# Patient Record
Sex: Female | Born: 1945
Health system: Southern US, Community
[De-identification: ages and names within clinical notes are randomized; demographics above are authoritative.]

## PROBLEM LIST (undated history)

## (undated) DIAGNOSIS — F32A Depression, unspecified: Secondary | ICD-10-CM

## (undated) DIAGNOSIS — E039 Hypothyroidism, unspecified: Secondary | ICD-10-CM

## (undated) DIAGNOSIS — I4891 Unspecified atrial fibrillation: Secondary | ICD-10-CM

## (undated) DIAGNOSIS — R519 Headache, unspecified: Secondary | ICD-10-CM

## (undated) DIAGNOSIS — M8580 Other specified disorders of bone density and structure, unspecified site: Secondary | ICD-10-CM

## (undated) DIAGNOSIS — F329 Major depressive disorder, single episode, unspecified: Secondary | ICD-10-CM

## (undated) DIAGNOSIS — E079 Disorder of thyroid, unspecified: Secondary | ICD-10-CM

## (undated) DIAGNOSIS — I499 Cardiac arrhythmia, unspecified: Secondary | ICD-10-CM

## (undated) DIAGNOSIS — K219 Gastro-esophageal reflux disease without esophagitis: Secondary | ICD-10-CM

## (undated) DIAGNOSIS — R51 Headache: Secondary | ICD-10-CM

## (undated) DIAGNOSIS — G250 Essential tremor: Secondary | ICD-10-CM

## (undated) DIAGNOSIS — J45909 Unspecified asthma, uncomplicated: Secondary | ICD-10-CM

## (undated) DIAGNOSIS — M1990 Unspecified osteoarthritis, unspecified site: Secondary | ICD-10-CM

## (undated) DIAGNOSIS — I10 Essential (primary) hypertension: Secondary | ICD-10-CM

## (undated) DIAGNOSIS — N189 Chronic kidney disease, unspecified: Secondary | ICD-10-CM

## (undated) DIAGNOSIS — M797 Fibromyalgia: Secondary | ICD-10-CM

## (undated) HISTORY — DX: Chronic kidney disease, unspecified: N18.9

## (undated) HISTORY — DX: Headache: R51

## (undated) HISTORY — DX: Cardiac arrhythmia, unspecified: I49.9

## (undated) HISTORY — DX: Major depressive disorder, single episode, unspecified: F32.9

## (undated) HISTORY — DX: Essential tremor: G25.0

## (undated) HISTORY — PX: GYNECOLOGIC CRYOSURGERY: SHX857

## (undated) HISTORY — DX: Headache, unspecified: R51.9

## (undated) HISTORY — DX: Unspecified osteoarthritis, unspecified site: M19.90

## (undated) HISTORY — PX: CHOLECYSTECTOMY: SHX55

## (undated) HISTORY — DX: Depression, unspecified: F32.A

## (undated) HISTORY — DX: Unspecified asthma, uncomplicated: J45.909

## (undated) HISTORY — DX: Unspecified atrial fibrillation: I48.91

## (undated) HISTORY — PX: OTHER SURGICAL HISTORY: SHX169

## (undated) HISTORY — DX: Fibromyalgia: M79.7

## (undated) HISTORY — DX: Other specified disorders of bone density and structure, unspecified site: M85.80

## (undated) HISTORY — DX: Disorder of thyroid, unspecified: E07.9

---

## 2002-04-25 ENCOUNTER — Encounter: Admission: RE | Admit: 2002-04-25 | Discharge: 2002-05-10 | Payer: Self-pay | Admitting: *Deleted

## 2004-10-23 ENCOUNTER — Ambulatory Visit: Payer: Self-pay | Admitting: Psychiatry

## 2004-11-06 ENCOUNTER — Ambulatory Visit: Payer: Self-pay | Admitting: Psychiatry

## 2009-06-26 ENCOUNTER — Ambulatory Visit: Admission: RE | Admit: 2009-06-26 | Discharge: 2009-06-26 | Payer: Self-pay | Admitting: Neurology

## 2009-08-16 ENCOUNTER — Ambulatory Visit: Admission: RE | Admit: 2009-08-16 | Discharge: 2009-08-16 | Payer: Self-pay | Admitting: Neurology

## 2010-12-23 ENCOUNTER — Ambulatory Visit (HOSPITAL_COMMUNITY): Admit: 2010-12-23 | Payer: Self-pay | Admitting: Psychology

## 2011-01-09 ENCOUNTER — Ambulatory Visit (HOSPITAL_COMMUNITY): Admit: 2011-01-09 | Payer: Self-pay | Admitting: Psychology

## 2011-05-06 NOTE — Procedures (Signed)
NAMEELLISON, LEISURE           ACCOUNT NO.:  [id number]   MEDICAL RECORD NO.:  [id number]          PATIENT TYPE:  OUT   LOCATION:  SLEE                          FACILITY:  [facility]   PHYSICIAN:  Kofi A. Gerilyn Pilgrim, M.D. DATE OF BIRTH:  09-23-1946   DATE OF PROCEDURE:  06/26/2009  DATE OF DISCHARGE:  06/26/2009                             SLEEP DISORDER REPORT   INDICATION:  This is a 62-year lady who presents with hypersomnia,  snoring and is being evaluated for obstructive sleep apnea syndrome.   MEDICATIONS:  Wellbutrin, MS Contin, Zantac, Singulair, Celexa,  levothyroxine, verapamil, Flovent, Veramyst, Relpax, aspirin, Prilosec,  multivitamin, calcium, vitamin D, C and omega-3.   Epworth sleepiness scale 18.  BMI 33.   ARCHITECTURAL SUMMARY:  This is a full night recording.  The total  recording time is 371 minutes.  The sleep efficiency is 62%, sleep  latency 56.5 minutes, REM latency 132.5 minutes.   STAGE N1:  20.4%.   STAGE N2:  68.3%.   STAGE N3:  2.6%.   REM SLEEP:  8.7%.   RESPIRATORY SUMMARY:  The baseline oxygen saturation is 99% with the  lowest saturation 87%.  The diagnostic AHI is 36.2 with majority of  events being central in origin.   LIMB MOVEMENTS:  PLM index is 0.   ELECTROCARDIOGRAM SUMMARY:  Average heart rate 77 with isolated PVCs and  PACs observed.   IMPRESSION:  Moderate obstructive sleep apnea syndrome.   RECOMMENDATIONS:  Formal titration study.     Kofi A. Gerilyn Pilgrim, M.D.  Electronically Signed    KAD/MEDQ  D:  07/05/2009  T:  07/05/2009  Job:  [id number]

## 2011-05-06 NOTE — Procedures (Signed)
Joy Hawkins, Joy Hawkins           ACCOUNT NO.:  [id number]   MEDICAL RECORD NO.:  [id number]          PATIENT TYPE:  OUT   LOCATION:  SLEE                          FACILITY:  [facility]   PHYSICIAN:  Kofi A. Gerilyn Pilgrim, M.D. DATE OF BIRTH:  08/27/1946   DATE OF PROCEDURE:  08/16/2009  DATE OF DISCHARGE:  08/16/2009                             SLEEP DISORDER REPORT   REFERRING PHYSICIAN:  Kofi A. Gerilyn Pilgrim, MD   INDICATIONS:  A 62-year lady who had a previous study showing sleep  apnea syndrome.  This is a titration recording.   MEDICATIONS:  Wellbutrin, MS Contin, Singulair, Zantac, Celexa,  amlodipine, verapamil, Flovent, Veramyst, Relpax, aspirin, Prilosec,  multivitamins, vitamin D, vitamin C, and Omega-3.   EPWORTH SLEEPINESS SCALE:  17.   BMI:  33.   ARCHITECTURE SUMMARY:  This is a titration recording.  The patient was  titrated at different levels.  She initially was tried on CPAP between  the 5 and 7.  Also tried on bilevel pressures at 8 and 4 and 7 and 3,  which do not make a significant difference.  She seemed to do best at 6  with a liter of oxygen.  Again, most of her events are central in  nature.   LIMB MOVEMENTS SUMMARY:  PLM index 0.   ELECTROCARDIOGRAM SUMMARY:  Average heart rate 79 with no significant  dysrhythmias observed.   IMPRESSION:  Central sleep apnea syndrome, which responds best to a CPAP  of 6 and 1 L of oxygen.   RECOMMENDATIONS:  CPAP of 6 with 2 L of nocturnal supplemental oxygen.      Kofi A. Gerilyn Pilgrim, M.D.  Electronically Signed     KAD/MEDQ  D:  08/24/2009  T:  08/25/2009  Job:  [id number]

## 2012-07-09 ENCOUNTER — Ambulatory Visit
Admission: RE | Admit: 2012-07-09 | Discharge: 2012-07-09 | Disposition: A | Discharge status: Home / Self Care | Payer: 59 | Source: Ambulatory Visit | Attending: Otolaryngology | Admitting: Otolaryngology

## 2012-07-09 ENCOUNTER — Other Ambulatory Visit: Payer: Self-pay | Admitting: Otolaryngology

## 2012-07-09 DIAGNOSIS — R51 Headache: Secondary | ICD-10-CM

## 2013-08-15 ENCOUNTER — Encounter: Payer: Self-pay | Admitting: Cardiology

## 2013-09-08 ENCOUNTER — Ambulatory Visit: Payer: 59 | Admitting: Cardiovascular Disease

## 2013-09-23 ENCOUNTER — Ambulatory Visit (INDEPENDENT_AMBULATORY_CARE_PROVIDER_SITE_OTHER): Payer: Medicare Other | Admitting: Cardiovascular Disease

## 2013-09-23 ENCOUNTER — Encounter: Payer: Self-pay | Admitting: *Deleted

## 2013-09-23 ENCOUNTER — Encounter: Payer: Self-pay | Admitting: Cardiovascular Disease

## 2013-09-23 VITALS — BP 134/79 | HR 90 | Ht 64.5 in | Wt 201.8 lb

## 2013-09-23 DIAGNOSIS — R0602 Shortness of breath: Secondary | ICD-10-CM | POA: Insufficient documentation

## 2013-09-23 DIAGNOSIS — R079 Chest pain, unspecified: Secondary | ICD-10-CM | POA: Insufficient documentation

## 2013-09-23 DIAGNOSIS — Z136 Encounter for screening for cardiovascular disorders: Secondary | ICD-10-CM

## 2013-09-23 DIAGNOSIS — I10 Essential (primary) hypertension: Secondary | ICD-10-CM | POA: Insufficient documentation

## 2013-09-23 NOTE — Patient Instructions (Addendum)
Your physician recommends that you schedule a follow-up appointment in: 3-4 weeks. Your physician recommends that you continue on your current medications as directed. Please refer to the Current Medication list given to you today. Your physician has requested that you have a lexiscan myoview. For further information please visit [email or web address] Please follow instruction sheet, as given. Your physician has requested that you have an echocardiogram. Echocardiography is a painless test that uses sound waves to create images of your heart. It provides your doctor with information about the size and shape of your heart and how well your heart's chambers and valves are working. This procedure takes approximately one hour. There are no restrictions for this procedure.

## 2013-09-23 NOTE — Progress Notes (Signed)
Patient ID: Joy Hawkins, female   DOB: September 11, 1946, 67 y.o.   MRN: [id number]       CARDIOLOGY CONSULT NOTE  Patient ID: Joy Hawkins MRN: [id number] DOB/AGE: 07-Oct-1946 67 y.o.  Admit date: (Not on file) Primary Physician Kirstie Peri, MD  Reason for Consultation: chest pain, SOB  HPI: [name] is a 67 year old woman with hypertension, GERD, asthma, and fibromyalgia who has been experiencing chest pressure and shortness of breath. A lipid panel in 05/2012 showed TC 239, TG 163, HDL 69. An ECG on 06-30-13 showed NSR, 71 bpm, and a PR interval of 222 ms. ECG today reveals NSR, 90 bpm, and a PR of 144 ms.  It started about 3 months ago, and says it began quite suddenly. On one day, she had a dull ache in her left arm, took 2 ASA, and this didn't help. She's on pain medications for fibromyalgia as well. It resolved by the next day. It happened again 3-4 days later and lasted 3 hours. It occurred again a week later and lasted all day.  The episodes of chest pressure started at about the same time. "It comes and goes and doesn't necessarily occur with exertion". They have a 2-story house and she climbs the stairs 8-10 times a day and has noticed getting out of breath more frequently, but not every time. It once awoke her from sleep.  She has had palpitations in the past. She takes verapamil for migraine prophylaxis. She's had no recent syncopal episodes, but has had some extreme lightheadedness and has occurred twice in the past 6 months.   FamHx: arrhythmias in mother's side of family. 2 brothers died of MI's, one at 3, one in his 26's. Mother had atrial fibrillation and sick sinus syndrome and a pacemaker for this. Father died of a stroke at 41.  SocHx: never smoked. 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 She lived for several years in [place], [place]. She is the sole caregiver for her husband who has dementia.    Allergies  Allergen Reactions  . Biaxin  [Clarithromycin]   . Ciprofloxacin   . Cymbalta [Duloxetine Hcl]   . Naproxen   . Penicillins   . Tequin [Gatifloxacin]   . Ultracet [Tramadol-Acetaminophen]   . Vicodin [Hydrocodone-Acetaminophen]     Current Outpatient Prescriptions  Medication Sig Dispense Refill  . albuterol (PROVENTIL HFA;VENTOLIN HFA) 108 (90 BASE) MCG/ACT inhaler Inhale 2 puffs into the lungs every 6 (six) hours as needed for wheezing.      Marland Kitchen amiloride-hydrochlorothiazide (MODURETIC) 5-50 MG tablet Take 1 tablet by mouth daily.      . Armodafinil (NUVIGIL) 150 MG tablet Take 150 mg by mouth as needed.       Marland Kitchen aspirin 81 MG tablet Take 81 mg by mouth daily.      Marland Kitchen b complex vitamins tablet Take 1 tablet by mouth daily.      . Calcium Citrate (CITRACAL PO) Take 2 tablets by mouth 2 (two) times daily.      . citalopram (CELEXA) 40 MG tablet Take 40 mg by mouth daily.      Marland Kitchen docusate sodium (COLACE) 100 MG capsule Take 100 mg by mouth daily.      . ergocalciferol (VITAMIN D2) 50000 UNITS capsule Take 50,000 Units by mouth once a week.      . esomeprazole (NEXIUM) 20 MG capsule Take 20 mg by mouth daily before breakfast.      . hydrOXYzine (  ATARAX/VISTARIL) 25 MG tablet Take 25 mg by mouth at bedtime as needed for itching.      . levothyroxine (SYNTHROID, LEVOTHROID) 75 MCG tablet Take 75 mcg by mouth daily before breakfast.      . lubiprostone (AMITIZA) 24 MCG capsule Take 24 mcg by mouth 2 (two) times daily with a meal.      . meloxicam (MOBIC) 7.5 MG tablet Take 7.5 mg by mouth as needed for pain.      . montelukast (SINGULAIR) 10 MG tablet Take 10 mg by mouth at bedtime.      Marland Kitchen morphine (MSIR) 30 MG tablet Take 30 mg by mouth 3 (three) times daily.      . Multiple Vitamin (MULTIVITAMIN) tablet Take 1 tablet by mouth daily.      Marland Kitchen oxybutynin (DITROPAN-XL) 5 MG 24 hr tablet Take 5 mg by mouth daily.      . traMADol (ULTRAM) 50 MG tablet Take 50 mg by mouth 2 (two) times daily as needed for pain.      . verapamil  (COVERA HS) 240 MG (CO) 24 hr tablet Take 240 mg by mouth at bedtime.      . vitamin C (ASCORBIC ACID) 500 MG tablet Take 500 mg by mouth every other day.       . zolmitriptan (ZOMIG) 5 MG tablet Take 5 mg by mouth as needed for migraine.       No current facility-administered medications for this visit.    No past medical history on file.  No past surgical history on file.  History   Social History  . Marital Status: Married    Spouse Name: N/A    Number of Children: N/A  . Years of Education: N/A   Occupational History  . Not on file.   Social History Main Topics  . Smoking status: Former Games developer  . Smokeless tobacco: Not on file  . Alcohol Use: Yes     Comment: Occasional  . Drug Use: Not on file  . Sexual Activity: Not on file   Other Topics Concern  . Not on file   Social History Narrative  . No narrative on file     Family History  Problem Relation Age of Onset  . Osteoporosis      family history  . Hypertension Mother   . Hypertension Father   . Stroke Father   . Hyperlipidemia Father   . Stroke Mother   . Cancer - Other Mother     basal cell     Prior to Admission medications   Medication Sig Start Date End Date Taking? Authorizing Provider  albuterol (PROVENTIL HFA;VENTOLIN HFA) 108 (90 BASE) MCG/ACT inhaler Inhale 2 puffs into the lungs every 6 (six) hours as needed for wheezing.   Yes Historical Provider, MD  amiloride-hydrochlorothiazide (MODURETIC) 5-50 MG tablet Take 1 tablet by mouth daily.   Yes Historical Provider, MD  Armodafinil (NUVIGIL) 150 MG tablet Take 150 mg by mouth as needed.    Yes Historical Provider, MD  aspirin 81 MG tablet Take 81 mg by mouth daily.   Yes Historical Provider, MD  b complex vitamins tablet Take 1 tablet by mouth daily.   Yes Historical Provider, MD  Calcium Citrate (CITRACAL PO) Take 2 tablets by mouth 2 (two) times daily.   Yes Historical Provider, MD  citalopram (CELEXA) 40 MG tablet Take 40 mg by mouth daily.    Yes Historical Provider, MD  docusate sodium (COLACE) 100 MG capsule Take  100 mg by mouth daily.   Yes Historical Provider, MD  ergocalciferol (VITAMIN D2) 50000 UNITS capsule Take 50,000 Units by mouth once a week.   Yes Historical Provider, MD  esomeprazole (NEXIUM) 20 MG capsule Take 20 mg by mouth daily before breakfast.   Yes Historical Provider, MD  hydrOXYzine (ATARAX/VISTARIL) 25 MG tablet Take 25 mg by mouth at bedtime as needed for itching.   Yes Historical Provider, MD  levothyroxine (SYNTHROID, LEVOTHROID) 75 MCG tablet Take 75 mcg by mouth daily before breakfast.   Yes Historical Provider, MD  lubiprostone (AMITIZA) 24 MCG capsule Take 24 mcg by mouth 2 (two) times daily with a meal.   Yes Historical Provider, MD  meloxicam (MOBIC) 7.5 MG tablet Take 7.5 mg by mouth as needed for pain.   Yes Historical Provider, MD  montelukast (SINGULAIR) 10 MG tablet Take 10 mg by mouth at bedtime.   Yes Historical Provider, MD  morphine (MSIR) 30 MG tablet Take 30 mg by mouth 3 (three) times daily.   Yes Historical Provider, MD  Multiple Vitamin (MULTIVITAMIN) tablet Take 1 tablet by mouth daily.   Yes Historical Provider, MD  oxybutynin (DITROPAN-XL) 5 MG 24 hr tablet Take 5 mg by mouth daily.   Yes Historical Provider, MD  traMADol (ULTRAM) 50 MG tablet Take 50 mg by mouth 2 (two) times daily as needed for pain.   Yes Historical Provider, MD  verapamil (COVERA HS) 240 MG (CO) 24 hr tablet Take 240 mg by mouth at bedtime.   Yes Historical Provider, MD  vitamin C (ASCORBIC ACID) 500 MG tablet Take 500 mg by mouth every other day.    Yes Historical Provider, MD  zolmitriptan (ZOMIG) 5 MG tablet Take 5 mg by mouth as needed for migraine.   Yes Historical Provider, MD     Review of systems complete and found to be negative unless listed above in HPI     Physical exam Blood pressure 134/79, pulse 90, height 5' 4.5" (1.638 m), weight 201 lb 12.8 oz (91.536 kg). General: NAD Neck: No JVD, no  thyromegaly or thyroid nodule.  Lungs: Clear to auscultation bilaterally with normal respiratory effort. CV: Nondisplaced PMI.  Heart regular S1/S2, no S3/S4, no murmur.  No peripheral edema.  No carotid bruit.  Normal pedal pulses.  Abdomen: Soft, nontender, no hepatosplenomegaly, no distention.  Skin: Intact without lesions or rashes.  Neurologic: Alert and oriented x 3.  Psych: Normal affect. Extremities: No clubbing or cyanosis.  HEENT: Normal.   Labs:   No results found for this basename: WBC, HGB, HCT, MCV, PLT   No results found for this basename: NA, K, CL, CO2, BUN, CREATININE, CALCIUM, LABALBU, PROT, BILITOT, ALKPHOS, ALT, AST, GLUCOSE,  in the last 168 hours No results found for this basename: CKTOTAL, CKMB, CKMBINDEX, TROPONINI    No results found for this basename: CHOL   No results found for this basename: HDL   No results found for this basename: LDLCALC   No results found for this basename: TRIG   No results found for this basename: CHOLHDL   No results found for this basename: LDLDIRECT       EKG: See HPI  Studies: No results found.  ASSESSMENT AND PLAN:  1. Chest pain and dyspnea with exertion: while she does have asthma, it is concerning that she's had chest pressure which awoke her from sleep. This combined with several episodes of left arm aching concerns me that she may have some underlying obstructive  coronary artery disease, especially given her family history. I will obtain an echocardiogram to evaluate for structural heart disease, and obtain a Lexiscan Cardiolite stress test to evaluate for ischemia. 2. HTN: controlled on current therapy.  Signed: Prentice Docker, M.D., F.A.C.C.  09/23/2013, 2:03 PM

## 2013-10-06 ENCOUNTER — Other Ambulatory Visit (HOSPITAL_COMMUNITY): Payer: Medicare Other

## 2013-10-12 ENCOUNTER — Other Ambulatory Visit (INDEPENDENT_AMBULATORY_CARE_PROVIDER_SITE_OTHER): Payer: Medicare Other

## 2013-10-12 ENCOUNTER — Other Ambulatory Visit: Payer: Self-pay

## 2013-10-12 DIAGNOSIS — R079 Chest pain, unspecified: Secondary | ICD-10-CM

## 2013-10-12 DIAGNOSIS — R0602 Shortness of breath: Secondary | ICD-10-CM

## 2013-10-18 ENCOUNTER — Telehealth: Payer: Self-pay | Admitting: *Deleted

## 2013-10-18 NOTE — Telephone Encounter (Signed)
Message copied by Lesle Chris on Tue Oct 18, 2013  1:53 PM ------      Message from: Prentice Docker A      Created: Thu Oct 13, 2013  1:58 PM       No changes in Rx. ------

## 2013-10-18 NOTE — Telephone Encounter (Signed)
Notes Recorded by Lesle Chris, LPN on 16/09/9603 at 1:51 PM Left message to return call.

## 2013-10-19 NOTE — Telephone Encounter (Signed)
Notes Recorded by Lesle Chris, LPN on 65/78/4696 at 4:18 PM Patient notified and verbalized understanding.

## 2013-10-21 ENCOUNTER — Encounter (HOSPITAL_COMMUNITY): Payer: Self-pay

## 2013-10-21 ENCOUNTER — Encounter (HOSPITAL_COMMUNITY)
Admission: RE | Admit: 2013-10-21 | Discharge: 2013-10-21 | Disposition: A | Discharge status: Home / Self Care | Payer: Medicare Other | Source: Ambulatory Visit | Attending: Cardiovascular Disease | Admitting: Cardiovascular Disease

## 2013-10-21 DIAGNOSIS — R079 Chest pain, unspecified: Secondary | ICD-10-CM | POA: Insufficient documentation

## 2013-10-21 DIAGNOSIS — R0602 Shortness of breath: Secondary | ICD-10-CM

## 2013-10-21 HISTORY — DX: Essential (primary) hypertension: I10

## 2013-10-21 MED ORDER — REGADENOSON 0.4 MG/5ML IV SOLN
INTRAVENOUS | Status: AC
  Administered 2013-10-21: 0.4 mg via INTRAVENOUS
  Filled 2013-10-21: qty 5

## 2013-10-21 MED ORDER — TECHNETIUM TC 99M SESTAMIBI - CARDIOLITE
10.0000 | Freq: Once | INTRAVENOUS | Status: AC | PRN
  Administered 2013-10-21: 10 via INTRAVENOUS

## 2013-10-21 MED ORDER — TECHNETIUM TC 99M SESTAMIBI GENERIC - CARDIOLITE
30.0000 | Freq: Once | INTRAVENOUS | Status: AC | PRN
  Administered 2013-10-21: 30 via INTRAVENOUS

## 2013-10-21 MED ORDER — SODIUM CHLORIDE 0.9 % IJ SOLN
INTRAMUSCULAR | Status: AC
  Administered 2013-10-21: 10 mL via INTRAVENOUS
  Filled 2013-10-21: qty 10

## 2013-10-21 NOTE — Progress Notes (Signed)
Stress Lab Nurses Notes - Joy Hawkins  Joy Hawkins 10/21/2013 Reason for doing test: Chest Pain and Dyspnea Type of test: [name] Nurse performing test: Parke Poisson, RN Nuclear Medicine Tech: Lou Cal Echo Tech: Not Applicable MD performing test: Ival Bible / K. Lawrence NP Family MD: Dr. Sherryll Burger Test explained and consent signed: yes IV started: 22g jelco, Saline lock flushed, No redness or edema and Saline lock started in radiology Symptoms: Chest pressure & weakness in legs Treatment/Intervention: None Reason test stopped: protocol completed After recovery IV was: Discontinued via X-ray tech and No redness or edema Patient to return to Nuc. Med at : 12:00 Patient discharged: Home Patient's Condition upon discharge was: stable Comments: During test BP 126/80 & HR 103. Recovery BP 125/80 & HR 79.  Symptoms resolved in recovery. [name] T

## 2013-10-26 ENCOUNTER — Telehealth: Payer: Self-pay | Admitting: *Deleted

## 2013-10-26 NOTE — Telephone Encounter (Signed)
Notes Recorded by Lesle Chris, LPN on 03/26/4097 at 10:11 AM Left message to return call.

## 2013-10-26 NOTE — Telephone Encounter (Signed)
Message copied by Lesle Chris on Wed Oct 26, 2013 10:12 AM ------      Message from: Prentice Docker A      Created: Wed Oct 26, 2013  9:11 AM       Please inform pt of normal results. ------

## 2013-10-27 NOTE — Telephone Encounter (Signed)
Notes Recorded by Lesle Chris, LPN on 16/12/958 at 2:36 PM Patient notified. Has follow up OV scheduled for 11/11 with Dr. Purvis Sheffield. ------

## 2013-11-01 ENCOUNTER — Ambulatory Visit: Payer: Medicare Other | Admitting: Cardiovascular Disease

## 2013-11-24 ENCOUNTER — Ambulatory Visit: Payer: Medicare Other | Admitting: Cardiovascular Disease

## 2013-11-25 ENCOUNTER — Ambulatory Visit: Payer: Medicare Other | Admitting: Cardiovascular Disease

## 2013-11-29 ENCOUNTER — Ambulatory Visit: Payer: Medicare Other | Admitting: Cardiovascular Disease

## 2013-12-16 ENCOUNTER — Ambulatory Visit (INDEPENDENT_AMBULATORY_CARE_PROVIDER_SITE_OTHER): Payer: Medicare Other | Admitting: Cardiovascular Disease

## 2013-12-16 ENCOUNTER — Encounter: Payer: Self-pay | Admitting: Cardiovascular Disease

## 2013-12-16 VITALS — BP 128/75 | HR 87 | Ht 64.0 in | Wt 200.8 lb

## 2013-12-16 DIAGNOSIS — R0602 Shortness of breath: Secondary | ICD-10-CM

## 2013-12-16 DIAGNOSIS — IMO0001 Reserved for inherently not codable concepts without codable children: Secondary | ICD-10-CM

## 2013-12-16 DIAGNOSIS — I10 Essential (primary) hypertension: Secondary | ICD-10-CM

## 2013-12-16 DIAGNOSIS — Z136 Encounter for screening for cardiovascular disorders: Secondary | ICD-10-CM

## 2013-12-16 DIAGNOSIS — K219 Gastro-esophageal reflux disease without esophagitis: Secondary | ICD-10-CM

## 2013-12-16 DIAGNOSIS — R079 Chest pain, unspecified: Secondary | ICD-10-CM

## 2013-12-16 MED ORDER — ESOMEPRAZOLE MAGNESIUM 20 MG PO CPDR: 20.0000 mg | DELAYED_RELEASE_CAPSULE | Freq: Two times a day (BID) | ORAL | Status: DC

## 2013-12-16 NOTE — Patient Instructions (Signed)
   Increase Nexium to 20mg  twice a day  - new sent to pharm  Continue all other medications.   Follow up in  8-10 weeks

## 2013-12-16 NOTE — Progress Notes (Signed)
Patient ID: Joy Hawkins, female   DOB: Jul 14, 1946, 67 y.o.   MRN: [id number]      SUBJECTIVE: The patient is here to followup on the results of cardiovascular testing performed for the evaluation of chest pain. Her nuclear stress test showed no evidence of ischemia or scar with breast attenuation artifact noted. Her echocardiogram showed normal left ventricular systolic function with an ejection fraction of 60-65%, with normal diastolic function. She continues to experience episodic chest pressure in the upper chest region, primarily retrosternally located. They occur more frequently at rest while she is lying down. She does have a history of gastroesophageal reflux disease. She also notices some shortness of breath when turning from right to her left when lying down in bed which is alleviated with her albuterol inhaler. She denies a history of anxiety. She does care for her husband who has dementia. She had previously been on omeprazole 80 mg daily as per pt.    Allergies  Allergen Reactions  . Biaxin [Clarithromycin]   . Ciprofloxacin   . Cymbalta [Duloxetine Hcl]   . Naproxen   . Penicillins   . Tequin [Gatifloxacin]   . Ultracet [Tramadol-Acetaminophen]   . Vicodin [Hydrocodone-Acetaminophen]     Current Outpatient Prescriptions  Medication Sig Dispense Refill  . albuterol (PROVENTIL HFA;VENTOLIN HFA) 108 (90 BASE) MCG/ACT inhaler Inhale 2 puffs into the lungs every 6 (six) hours as needed for wheezing.      Marland Kitchen amiloride-hydrochlorothiazide (MODURETIC) 5-50 MG tablet Take 1 tablet by mouth daily.      . Armodafinil (NUVIGIL) 150 MG tablet Take 150 mg by mouth as needed.       Marland Kitchen aspirin 81 MG tablet Take 81 mg by mouth daily.      Marland Kitchen b complex vitamins tablet Take 1 tablet by mouth daily.      . Calcium Citrate (CITRACAL PO) Take 2 tablets by mouth 2 (two) times daily.      . citalopram (CELEXA) 40 MG tablet Take 40 mg by mouth daily.      Marland Kitchen docusate sodium (COLACE) 100 MG  capsule Take 100 mg by mouth daily.      Marland Kitchen eletriptan (RELPAX) 40 MG tablet Take 40 mg by mouth 2 (two) times daily as needed for migraine or headache. One tablet by mouth at onset of headache. May repeat in 2 hours if headache persists or recurs.      . ergocalciferol (VITAMIN D2) 50000 UNITS capsule Take 50,000 Units by mouth once a week.      . esomeprazole (NEXIUM) 20 MG capsule Take 1 capsule (20 mg total) by mouth 2 (two) times daily.  60 capsule  6  . levothyroxine (SYNTHROID, LEVOTHROID) 75 MCG tablet Take 75 mcg by mouth daily before breakfast.      . lubiprostone (AMITIZA) 24 MCG capsule Take 24 mcg by mouth 2 (two) times daily with a meal.      . meloxicam (MOBIC) 7.5 MG tablet Take 7.5 mg by mouth as needed for pain.      . montelukast (SINGULAIR) 10 MG tablet Take 10 mg by mouth at bedtime.      Marland Kitchen morphine (MSIR) 30 MG tablet Take 30 mg by mouth 3 (three) times daily.      . Multiple Vitamin (MULTIVITAMIN) tablet Take 1 tablet by mouth daily.      Marland Kitchen oxybutynin (DITROPAN-XL) 5 MG 24 hr tablet Take 5 mg by mouth daily.      . traMADol (ULTRAM) 50  MG tablet Take 50 mg by mouth 2 (two) times daily as needed for pain.      . verapamil (COVERA HS) 240 MG (CO) 24 hr tablet Take 240 mg by mouth at bedtime.      . vitamin C (ASCORBIC ACID) 500 MG tablet Take 500 mg by mouth every other day.        No current facility-administered medications for this visit.    Past Medical History  Diagnosis Date  . Hypertension     No past surgical history on file.  History   Social History  . Marital Status: Married    Spouse Name: N/A    Number of Children: N/A  . Years of Education: N/A   Occupational History  . Not on file.   Social History Main Topics  . Smoking status: Former Games developer  . Smokeless tobacco: Not on file  . Alcohol Use: Yes     Comment: Occasional  . Drug Use: Not on file  . Sexual Activity: Not on file   Other Topics Concern  . Not on file   Social History  Narrative  . No narrative on file     Filed Vitals:   12/16/13 1557  BP: 128/75  Pulse: 87  Height: 5\' 4"  (1.626 m)  Weight: 200 lb 12.8 oz (91.082 kg)    PHYSICAL EXAM General: NAD Neck: No JVD, no thyromegaly or thyroid nodule.  Lungs: Clear to auscultation bilaterally with normal respiratory effort. CV: Nondisplaced PMI.  Chest wall tenderness along bilateral sternal borders. Heart regular S1/S2, no S3/S4, no murmur.  No peripheral edema.  No carotid bruit.  Normal pedal pulses.  Abdomen: Soft, nontender, no hepatosplenomegaly, no distention.  Neurologic: Alert and oriented x 3.  Psych: Normal affect. Extremities: No clubbing or cyanosis.   ECG: reviewed and available in electronic records.      ASSESSMENT AND PLAN: 1. Chest pressure: given her normal nuclear myocardial perfusion study and echocardiogram, along with her h/o GERD, it is quite likely her symptoms are arising from esophageal disease, perhaps spasm. I will increase her Nexium to 20 mg bid. She has a h/o migraines, and thus I will try to avoid nitrate therapy for the time being. I will f/u with her in 8-10 weeks and if she were to continue to experience these symptoms, I may consider isosorbide mononitrate 30 mg daily. 2. HTN: controlled on current therapy.  Dispo: f/u 8-10 weeks.   Prentice Docker, M.D., F.A.C.C.

## 2014-01-30 ENCOUNTER — Ambulatory Visit: Payer: Medicare Other | Admitting: Cardiovascular Disease

## 2014-02-26 IMAGING — NM NM MYOCAR SINGLE W/SPECT W/WALL MOTION & EF
2 series · 12 of 12 positions shown · non-contrast
Comparison: None.

CLINICAL DATA: 66-year-old woman with hypertension, GERD, asthma,
presenting with symptoms of chest pain and dyspnea on exertion. This
study is requested to evaluate for the presence of ischemia.

EXAM:
MYOCARDIAL IMAGING WITH SPECT (REST AND PHARMACOLOGIC-STRESS)
GATED LEFT VENTRICULAR WALL MOTION STUDY
LEFT VENTRICULAR EJECTION FRACTION
TECHNIQUE: Standard myocardial SPECT imaging was performed after resting
intravenous injection of 10 mCi Nc-WWm sestamibi. Subsequently,
intravenous infusion of Lexiscan was performed under the supervision
of the Cardiology staff. At peak effect of the drug, 30 mCi Nc-WWm
sestamibi was injected intravenously and standard myocardial SPECT
imaging was performed. Quantitative gated imaging was also performed
to evaluate left ventricular wall motion, and estimate left
ventricular ejection fraction.

[cardiac rest stress · 6.39mm/px · 6 of 512 frames shown (1 of 2)]
[frame 43/512]
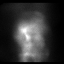
[frame 128/512]
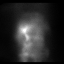
[frame 214/512]
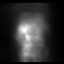
[frame 299/512]
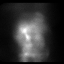
[frame 384/512]
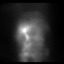
[frame 470/512]
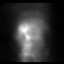

[cardiac rest stress · 6.39mm/px · 6 of 64 frames shown (2 of 2)]
[frame 6/64]
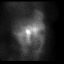
[frame 16/64]
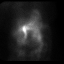
[frame 27/64]
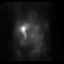
[frame 38/64]
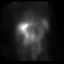
[frame 48/64]
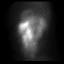
[frame 59/64]
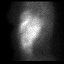

[12 of 12 positions shown; findings below may reference images not displayed]

FINDINGS: Baseline tracing shows normal sinus rhythm at 80 beats per min,
nonspecific T-wave changes with borderline low voltage in the
lateral precordial leads. Lexiscan bolus was given in standard
fashion. Heart rate increased from 75 beats per min up to 103 beats
per min, and blood pressure remained stable at 132/85. Patient
tolerated infusion well, no chest pain was reported. There were no
diagnostic ST segment abnormalities, a rare PVC was noted.

Analysis of the raw perfusion data finds significant breast
attenuation.

Tomographic views were obtained using the short axis, vertical long
axis, and horizontal long axis planes. There is a small, mild
intensity, apical anteroseptal defect noted. This region is fixed,
actually more pronounced at rest than at stress, consistent with
attenuation rather than scar. No ischemic defects are noted.

Gated imaging reveals an EDV of 73, ESV of 18, TID ratio of 1.15,
and LVEF of 75% without wall motion abnormalities.
IMPRESSION: Low risk Lexiscan Cardiolite. No diagnostic ST segment abnormalities
were noted. Perfusion imaging is consistent with breast attenuation,
no clear evidence of scar or ischemia. LVEF is normal at 75% with
normal LV volumes and no wall motion abnormalities.

## 2014-03-21 ENCOUNTER — Encounter: Payer: Self-pay | Admitting: Cardiovascular Disease

## 2014-03-21 ENCOUNTER — Ambulatory Visit (INDEPENDENT_AMBULATORY_CARE_PROVIDER_SITE_OTHER): Payer: Medicare Other | Admitting: Cardiovascular Disease

## 2014-03-21 VITALS — BP 108/67 | HR 68 | Ht 64.0 in | Wt 187.0 lb

## 2014-03-21 DIAGNOSIS — R002 Palpitations: Secondary | ICD-10-CM

## 2014-03-21 DIAGNOSIS — I10 Essential (primary) hypertension: Secondary | ICD-10-CM

## 2014-03-21 DIAGNOSIS — R079 Chest pain, unspecified: Secondary | ICD-10-CM

## 2014-03-21 DIAGNOSIS — K219 Gastro-esophageal reflux disease without esophagitis: Secondary | ICD-10-CM

## 2014-03-21 NOTE — Addendum Note (Signed)
Addended by: Lesle ChrisHILL, Calyb Mcquarrie G on: 03/21/2014 01:25 PM   Modules accepted: Orders

## 2014-03-21 NOTE — Patient Instructions (Signed)
Your physician has recommended that you wear a 30 day event monitor. Event monitors are medical devices that record the heart's electrical activity. Doctors most often us these monitors to diagnose arrhythmias. Arrhythmias are problems with the speed or rhythm of the heartbeat. The monitor is a small, portable device. You can wear one while you do your normal daily activities. This is usually used to diagnose what is causing palpitations/syncope (passing out). Office will contact with results via phone or letter.    Continue all current medications. Follow up in  2 months   

## 2014-03-21 NOTE — Progress Notes (Signed)
Patient ID: Joy Hawkins, female   DOB: 24-Jul-1946, 68 y.o.   MRN: [id number]      SUBJECTIVE: The patient is here to followup for the ongoing management of chest discomfort. She previously underwent a normal nuclear stress test and echocardiogram. She has a history of GERD. She said the increased dose of Nexium has markedly decreased her chest discomfort. However, the medication is costly for her. She also complains of palpitations, with associated shortness of breath. She denies associated chest pain or dizziness. It may last 5 minutes but up to 15 minutes. It may occur a few times a month. She currently takes propranolol for essential tremors and verapamil for migraine prophylaxis.    Allergies  Allergen Reactions  . Biaxin [Clarithromycin]   . Ciprofloxacin   . Cymbalta [Duloxetine Hcl]   . Naproxen   . Penicillins   . Tequin [Gatifloxacin]   . Ultracet [Tramadol-Acetaminophen]   . Vicodin [Hydrocodone-Acetaminophen]     Current Outpatient Prescriptions  Medication Sig Dispense Refill  . albuterol (PROVENTIL HFA;VENTOLIN HFA) 108 (90 BASE) MCG/ACT inhaler Inhale 2 puffs into the lungs every 6 (six) hours as needed for wheezing.      Marland Kitchen amiloride-hydrochlorothiazide (MODURETIC) 5-50 MG tablet Take 1 tablet by mouth daily.      . Armodafinil (NUVIGIL) 150 MG tablet Take 150 mg by mouth as needed.       Marland Kitchen aspirin 81 MG tablet Take 81 mg by mouth daily.      Marland Kitchen b complex vitamins tablet Take 1 tablet by mouth daily.      . Calcium Citrate (CITRACAL PO) Take 2 tablets by mouth 2 (two) times daily.      . citalopram (CELEXA) 40 MG tablet Take 40 mg by mouth daily.      Marland Kitchen docusate sodium (COLACE) 100 MG capsule Take 100 mg by mouth daily.      Marland Kitchen eletriptan (RELPAX) 40 MG tablet Take 40 mg by mouth 2 (two) times daily as needed for migraine or headache. One tablet by mouth at onset of headache. May repeat in 2 hours if headache persists or recurs.      . ergocalciferol (VITAMIN  D2) 50000 UNITS capsule Take 50,000 Units by mouth once a week.      . esomeprazole (NEXIUM) 20 MG capsule Take 1 capsule (20 mg total) by mouth 2 (two) times daily.  60 capsule  6  . levothyroxine (SYNTHROID, LEVOTHROID) 75 MCG tablet Take 75 mcg by mouth daily before breakfast.      . lubiprostone (AMITIZA) 24 MCG capsule Take 24 mcg by mouth 2 (two) times daily with a meal.      . meloxicam (MOBIC) 7.5 MG tablet Take 7.5 mg by mouth as needed for pain.      . montelukast (SINGULAIR) 10 MG tablet Take 10 mg by mouth at bedtime.      Marland Kitchen morphine (MSIR) 30 MG tablet Take 30 mg by mouth 3 (three) times daily.      . Multiple Vitamin (MULTIVITAMIN) tablet Take 1 tablet by mouth daily.      Marland Kitchen oxybutynin (DITROPAN-XL) 5 MG 24 hr tablet Take 5 mg by mouth daily.      . traMADol (ULTRAM) 50 MG tablet Take 50 mg by mouth 2 (two) times daily as needed for pain.      . verapamil (COVERA HS) 240 MG (CO) 24 hr tablet Take 240 mg by mouth at bedtime.      . vitamin C (  ASCORBIC ACID) 500 MG tablet Take 500 mg by mouth every other day.        No current facility-administered medications for this visit.    Past Medical History  Diagnosis Date  . Hypertension     No past surgical history on file.  History   Social History  . Marital Status: Married    Spouse Name: N/A    Number of Children: N/A  . Years of Education: N/A   Occupational History  . Not on file.   Social History Main Topics  . Smoking status: Former Games developermoker  . Smokeless tobacco: Not on file  . Alcohol Use: Yes     Comment: Occasional  . Drug Use: Not on file  . Sexual Activity: Not on file   Other Topics Concern  . Not on file   Social History Narrative  . No narrative on file     BP 108/67 Pulse 68   PHYSICAL EXAM General: NAD Neck: No JVD, no thyromegaly. Lungs: Clear to auscultation bilaterally with normal respiratory effort. CV: Nondisplaced PMI.  Regular rate and rhythm, normal S1/S2, no S3/S4, no murmur. No  pretibial or periankle edema.  No carotid bruit.  Normal pedal pulses.  Abdomen: Soft, nontender, no hepatosplenomegaly, no distention.  Neurologic: Alert and oriented x 3.  Psych: Normal affect. Extremities: No clubbing or cyanosis.   ECG: reviewed and available in electronic records.      ASSESSMENT AND PLAN: 1. Chest pressure: Her symptoms have subsided on Nexium 20 mg bid, but it is too costly for her. I have recommended she take an increased dose of OTC Prilosec or generic omeprazole. Given her normal nuclear myocardial perfusion study and echocardiogram, along with her h/o GERD, her symptoms appear to be arising from esophageal disease, perhaps spasm. She has a h/o migraines, and thus I will try to avoid nitrate therapy for the time being. 2. HTN: controlled on current therapy.  3. Palpitations: While these are infrequent, she is bothered by them when they last for up to 15 minutes. I will obtain a 30-day event monitor to evaluate for tachyarrhythmias (SVT, atrial fibrillation).  Dispo: f/u 8-10 weeks.    Prentice DockerSuresh Rhylan Kagel, M.D., F.A.C.C.

## 2014-03-23 ENCOUNTER — Other Ambulatory Visit: Payer: Self-pay | Admitting: *Deleted

## 2014-03-23 DIAGNOSIS — R002 Palpitations: Secondary | ICD-10-CM

## 2014-06-02 ENCOUNTER — Ambulatory Visit: Payer: Medicare Other | Admitting: Cardiovascular Disease

## 2014-08-16 ENCOUNTER — Telehealth: Payer: Self-pay | Admitting: Cardiovascular Disease

## 2014-08-16 NOTE — Telephone Encounter (Signed)
Patient states that she has gotten her insurance issue worked out now.  Last seen by Dr. Purvis Sheffield in March.  He wanted her to to do 30 day event monitor, but was unable at that time.  She will come by office tomorrow to give [place] copy of new insurance card.

## 2014-08-16 NOTE — Telephone Encounter (Signed)
RETURNING TELEPHONE CALL

## 2014-08-21 NOTE — Telephone Encounter (Signed)
Patient brought in insurance card.  Will go ahead & order monitor.   Patient aware & follow up OV scheduled for 10/20/2014 with Dr. Purvis Sheffield.

## 2014-08-29 DIAGNOSIS — R002 Palpitations: Secondary | ICD-10-CM

## 2014-09-14 ENCOUNTER — Telehealth: Payer: Self-pay | Admitting: Cardiology

## 2014-09-14 NOTE — Telephone Encounter (Signed)
Called by [employer] monitoring system concerning patient's heart rhythm.  Patient has first documentation of atrial fibrillation with RVR at 135bpm.  She had some mild chest pressure earlier in the evening which has resolved.  She says that she has been having chest pressure for weeks only when she gets the palpitations.   I have recommended that she go to [facility] for further evaluation since she lives in [place].  She stated that she absolutely could not go to the ER because she has her 60 year old grandson with her and has no one to leave him with.  She lost her husband 2 months ago and her daughter is in [place] taking care of some business.  I explained to her that going into afib she is at increased risk of cardioembolic events as well as CHF if she remains in a rapid rate.  She has just taken her nightly Verapamil dose.  Again, I expressed my concern and she is going to try to find someone to take her grandson so she can go to the ER.  She understands the risks involved in not being evaluated tonight.  She was instructed to call Dr. Junius Argyle office in the am if she decides not to go to the ER.  I have instructed her to take another Propranolol  tablet now ( her last one was this am).

## 2014-09-15 ENCOUNTER — Telehealth: Payer: Self-pay | Admitting: *Deleted

## 2014-09-15 NOTE — Telephone Encounter (Signed)
Can u please f/u to see if pt went to ER as Dr. Mayford Knife suggested? Received: Today      Laqueta Linden, MD    Lesle Chris, LPN

## 2014-09-15 NOTE — Telephone Encounter (Signed)
Office visit scheduled for Wednesday, 09/20/2014 at 1:20 with Dr. Purvis Sheffield.    Patient aware.  Also, advised that she may take extra Propranolol  if same type of emergent situation arises.  Also, discussed having back up plan in case she has another episode over weekend & may be advised to go to ED.  Stated she has already worked this out.

## 2014-09-15 NOTE — Telephone Encounter (Signed)
Received return call from patient.  Stated that she was not able to go to ED as suggested due to having her 66 year old grandson with her & no other person to leave him with.  Stated that she is feeling okay now.  She is on day 17 of 30.  Follow up has been scheduled for 10/20/2014.

## 2014-09-15 NOTE — Telephone Encounter (Signed)
Please have her see me sooner in clinic, if possible next week. If not, then the following week on 10/8.

## 2014-09-15 NOTE — Telephone Encounter (Signed)
Left message to return call.  Checked [facility] [place] [facility]) & did not see listing for her being there.

## 2014-09-20 ENCOUNTER — Other Ambulatory Visit: Payer: Self-pay | Admitting: *Deleted

## 2014-09-20 ENCOUNTER — Encounter: Payer: Self-pay | Admitting: Cardiovascular Disease

## 2014-09-20 ENCOUNTER — Ambulatory Visit (INDEPENDENT_AMBULATORY_CARE_PROVIDER_SITE_OTHER): Payer: Medicare Other | Admitting: Cardiovascular Disease

## 2014-09-20 VITALS — BP 111/77 | HR 73 | Ht 64.0 in | Wt 177.0 lb

## 2014-09-20 DIAGNOSIS — K219 Gastro-esophageal reflux disease without esophagitis: Secondary | ICD-10-CM

## 2014-09-20 DIAGNOSIS — I4891 Unspecified atrial fibrillation: Secondary | ICD-10-CM

## 2014-09-20 DIAGNOSIS — E038 Other specified hypothyroidism: Secondary | ICD-10-CM

## 2014-09-20 DIAGNOSIS — Z7189 Other specified counseling: Secondary | ICD-10-CM

## 2014-09-20 DIAGNOSIS — I10 Essential (primary) hypertension: Secondary | ICD-10-CM

## 2014-09-20 MED ORDER — RIVAROXABAN 20 MG PO TABS: 20.0000 mg | ORAL_TABLET | Freq: Every day | ORAL | Status: DC

## 2014-09-20 MED ORDER — VERAPAMIL HCL 240 MG (CO) PO TB24: ORAL_TABLET | ORAL | Status: DC

## 2014-09-20 MED ORDER — AMILORIDE-HYDROCHLOROTHIAZIDE 5-50 MG PO TABS: 0.5000 | ORAL_TABLET | Freq: Every day | ORAL | Status: DC

## 2014-09-20 NOTE — Patient Instructions (Addendum)
   Decrease Moduretic to half tablet daily.  Increase Verapamil to 120mg  in the morning and 240mg  in the evening.  Xarelto samples given. 20mg  daily Continue all other medications.   Labs for TSH, CBC, BMET, Free T4 to be done. Office will contact with results via phone or letter.   Your physician wants you to follow up in:  1 month.

## 2014-09-20 NOTE — Addendum Note (Signed)
Addended by: Jerrye BeaversJONES, Karver Fadden on: 09/20/2014 02:48 PM   Modules accepted: Orders

## 2014-09-20 NOTE — Progress Notes (Signed)
Patient ID: Joy Hawkins, female   DOB: [birth year], 68 y.o.   MRN: [id number]      SUBJECTIVE: The patient has a history of hypertension and GERD. She takes propranolol for essential tremors and verapamil for migraine prophylaxis. She had been experiencing chest pressure and palpitations and I had her wear an event monitor, which demonstrated atrial fibrillation with a rapid ventricular response, heart rate 135 beats per minute, with associated chest pressure. She was instructed by Dr. Mayford Knifeurner to go to the emergency room, but she was unable to do so as she was taking care of her grandson.  Since that time, she experiences palpitations associated with mild chest pressure lasting approximately 15 minutes once a week. She denies dizziness, shortness of breath and leg swelling. She is planning on driving to [place] in 10 days to take her husband's ashes there.  Review of Systems: As per "subjective", otherwise negative.  Allergies  Allergen Reactions  . Biaxin [Clarithromycin]   . Ciprofloxacin   . Cymbalta [Duloxetine Hcl]   . Naproxen   . Penicillins   . Tequin [Gatifloxacin]   . Ultracet [Tramadol-Acetaminophen]   . Vicodin [Hydrocodone-Acetaminophen]     Current Outpatient Prescriptions  Medication Sig Dispense Refill  . albuterol (PROVENTIL HFA;VENTOLIN HFA) 108 (90 BASE) MCG/ACT inhaler Inhale 2 puffs into the lungs every 6 (six) hours as needed for wheezing.      Marland Kitchen. amiloride-hydrochlorothiazide (MODURETIC) 5-50 MG tablet Take 1 tablet by mouth daily.      Marland Kitchen. aspirin 81 MG tablet Take 81 mg by mouth daily.      Marland Kitchen. b complex vitamins tablet Take 1 tablet by mouth daily.      . Calcium Citrate (CITRACAL PO) Take 1 tablet by mouth 2 (two) times daily.       . citalopram (CELEXA) 40 MG tablet Take 40 mg by mouth daily.      Marland Kitchen. docusate sodium (COLACE) 100 MG capsule Take 100 mg by mouth daily.      Marland Kitchen. eletriptan (RELPAX) 40 MG tablet Take 40 mg by mouth 2 (two) times daily as needed  for migraine or headache. One tablet by mouth at onset of headache. May repeat in 2 hours if headache persists or recurs.      . ergocalciferol (VITAMIN D2) 50000 UNITS capsule Take 50,000 Units by mouth once a week.      . esomeprazole (NEXIUM) 20 MG capsule Take 1 capsule (20 mg total) by mouth 2 (two) times daily.  60 capsule  6  . levothyroxine (SYNTHROID, LEVOTHROID) 75 MCG tablet Take 75 mcg by mouth daily before breakfast.      . lubiprostone (AMITIZA) 24 MCG capsule Take 24 mcg by mouth 2 (two) times daily with a meal.      . meloxicam (MOBIC) 7.5 MG tablet Take 7.5 mg by mouth as needed for pain.      . montelukast (SINGULAIR) 10 MG tablet Take 10 mg by mouth daily.       Marland Kitchen. morphine (MSIR) 30 MG tablet Take 30 mg by mouth 3 (three) times daily.      . Multiple Vitamin (MULTIVITAMIN) tablet Take 1 tablet by mouth daily.      Marland Kitchen. oxybutynin (DITROPAN-XL) 5 MG 24 hr tablet Take 5 mg by mouth daily.      . propranolol (INDERAL) 10 MG tablet Take 10 mg by mouth daily.      . traMADol (ULTRAM) 50 MG tablet Take 50-100 mg by mouth every 6 (  six) hours as needed.       . verapamil (COVERA HS) 240 MG (CO) 24 hr tablet Take 240 mg by mouth at bedtime.      . vitamin C (ASCORBIC ACID) 500 MG tablet Take 500 mg by mouth every other day.        No current facility-administered medications for this visit.    Past Medical History  Diagnosis Date  . Hypertension     No past surgical history on file.  History   Social History  . Marital Status: Married    Spouse Name: N/A    Number of Children: N/A  . Years of Education: N/A   Occupational History  . Not on file.   Social History Main Topics  . Smoking status: Never Smoker   . Smokeless tobacco: Never Used  . Alcohol Use: Yes     Comment: Occasional  . Drug Use: Not on file  . Sexual Activity: Not on file   Other Topics Concern  . Not on file   Social History Narrative  . No narrative on file     Filed Vitals:   09/20/14  1328  BP: 111/77  Pulse: 73  Height: 5\' 4"  (1.626 m)  Weight: 177 lb (80.287 kg)    PHYSICAL EXAM General: NAD HEENT: Normal. Neck: No JVD, no thyromegaly. Lungs: Clear to auscultation bilaterally with normal respiratory effort. CV: Nondisplaced PMI.  Regular rate and rhythm, normal S1/S2, no S3/S4, no murmur. No pretibial or periankle edema.  No carotid bruit.  Normal pedal pulses.  Abdomen: Soft, nontender, no hepatosplenomegaly, no distention.  Neurologic: Alert and oriented x 3.  Psych: Normal affect. Skin: Normal. Musculoskeletal: Normal range of motion, no gross deformities. Extremities: No clubbing or cyanosis.   ECG: Most recent ECG reviewed.      ASSESSMENT AND PLAN: 1. Atrial fibrillation with RVR: In order to attenuate rapid atrial fibrillation, I will increase verapamil to 120 mg q am and 240 mg q pm. CHADS-VASC score is 3 (HTN, age, gender), thus putting her at an increased risk for a thromboembolic event. I will initiate Xarelto 20 mg daily. Will check a TSH, CBC, free T4, and BMET. Left atrial size is normal as is LV systolic function. 2. Essential HTN: Controlled on present therapy. In order to allow for the increased dose of verapamil, will cut back Moduretic to one-half tablet daily. 3. GERD: On Nexium.  Dispo: f/u 1 month.  Prentice Docker, M.D., F.A.C.C.

## 2014-09-21 ENCOUNTER — Other Ambulatory Visit: Payer: Self-pay | Admitting: *Deleted

## 2014-09-21 MED ORDER — RIVAROXABAN 20 MG PO TABS: 20.0000 mg | ORAL_TABLET | Freq: Every day | ORAL | Status: DC

## 2014-10-02 ENCOUNTER — Telehealth: Payer: Self-pay | Admitting: *Deleted

## 2014-10-02 NOTE — Telephone Encounter (Signed)
Patient c/o Xarelto causing headache.  Started medication on 09/20/2014.  Headache going on x 1 week.  States that she does have migraines occasionally & this feels totally different than that type of pain.  Patient describes this as a "constant sharpish pain".  Stated that she does have to go [place] [employer].  Concerned about driving such a long distance with this.

## 2014-10-03 NOTE — Telephone Encounter (Signed)
Patient notified.  Stated that she has not done labs yet.  Stated that she was planning to do this with her PMD, but would not do them without an office visit first.  Advised her [facility].  Stated that she did have slight headache again this morning, but not as bad as before.  Thinks maybe her body may be trying to adjust to the medication.  She will leaving to go OOT tomorrow and will not be back till 10/14/2014.  She will continue her Xarelto for now & call me upon her return to start new medication if she feels is still necessary.  Patient really did not want to start new medication while being OOT.

## 2014-10-03 NOTE — Telephone Encounter (Signed)
Did she get BMET done? I would consider Savaysa. If not, can start Eliquis 5 mg bid.

## 2014-10-20 ENCOUNTER — Encounter: Payer: Self-pay | Admitting: Cardiovascular Disease

## 2014-10-20 ENCOUNTER — Ambulatory Visit (INDEPENDENT_AMBULATORY_CARE_PROVIDER_SITE_OTHER): Payer: Medicare Other | Admitting: Cardiovascular Disease

## 2014-10-20 ENCOUNTER — Ambulatory Visit: Payer: Medicare Other | Admitting: Cardiovascular Disease

## 2014-10-20 VITALS — BP 115/76 | HR 71 | Ht 64.0 in | Wt 177.0 lb

## 2014-10-20 DIAGNOSIS — K219 Gastro-esophageal reflux disease without esophagitis: Secondary | ICD-10-CM

## 2014-10-20 DIAGNOSIS — I10 Essential (primary) hypertension: Secondary | ICD-10-CM

## 2014-10-20 DIAGNOSIS — R079 Chest pain, unspecified: Secondary | ICD-10-CM

## 2014-10-20 DIAGNOSIS — R002 Palpitations: Secondary | ICD-10-CM

## 2014-10-20 DIAGNOSIS — I4891 Unspecified atrial fibrillation: Secondary | ICD-10-CM

## 2014-10-20 MED ORDER — APIXABAN 5 MG PO TABS: 5.0000 mg | ORAL_TABLET | Freq: Two times a day (BID) | ORAL | Status: DC

## 2014-10-20 MED ORDER — METOPROLOL TARTRATE 25 MG PO TABS: 25.0000 mg | ORAL_TABLET | Freq: Two times a day (BID) | ORAL | Status: DC

## 2014-10-20 NOTE — Progress Notes (Signed)
Patient ID: Joy Hawkins, female   DOB: [birth year], 68 y.o.   MRN: [id number]      SUBJECTIVE: The patient is here to follow-up for atrial fibrillation. Xarelto gave her a headache and caused her pulsatile tinnitus to become aggravated. She also developed spontaneous gum bleeds and complains of easy bruisability. She had some long episodes of palpitations while she was driving to [place] and while in [place]. She experiences palpitations every other day associated with chest pressure. She got her blood work completed this morning and the results are unavailable to me at this time. She has leftover metoprolol tartrate which belonged to her husband at home. She is taking ASA.  ECG performed in the office today demonstrated sinus rhythm with a right bundle branch block, heart rate 71 bpm.   Review of Systems: As per "subjective", otherwise negative.  Allergies  Allergen Reactions  . Biaxin [Clarithromycin]   . Ciprofloxacin   . Cymbalta [Duloxetine Hcl]   . Naproxen   . Penicillins   . Tequin [Gatifloxacin]   . Ultracet [Tramadol-Acetaminophen]   . Vicodin [Hydrocodone-Acetaminophen]     Current Outpatient Prescriptions  Medication Sig Dispense Refill  . albuterol (PROVENTIL HFA;VENTOLIN HFA) 108 (90 BASE) MCG/ACT inhaler Inhale 2 puffs into the lungs every 6 (six) hours as needed for wheezing.      Marland Kitchen. amiloride-hydrochlorothiazide (MODURETIC) 5-50 MG tablet Take 0.5 tablets by mouth daily. Take half tablet daily.  30 tablet  3  . aspirin 81 MG tablet Take 81 mg by mouth daily.      Marland Kitchen. b complex vitamins tablet Take 1 tablet by mouth daily.      . Calcium Citrate (CITRACAL PO) Take 1 tablet by mouth 2 (two) times daily.       . citalopram (CELEXA) 40 MG tablet Take 40 mg by mouth daily.      Marland Kitchen. docusate sodium (COLACE) 100 MG capsule Take 100 mg by mouth daily.      Marland Kitchen. eletriptan (RELPAX) 40 MG tablet Take 40 mg by mouth 2 (two) times daily as needed for migraine or headache. One  tablet by mouth at onset of headache. May repeat in 2 hours if headache persists or recurs.      . ergocalciferol (VITAMIN D2) 50000 UNITS capsule Take 50,000 Units by mouth once a week.      . levothyroxine (SYNTHROID, LEVOTHROID) 75 MCG tablet Take 75 mcg by mouth daily before breakfast.      . lubiprostone (AMITIZA) 24 MCG capsule Take 24 mcg by mouth 2 (two) times daily with a meal.      . meloxicam (MOBIC) 7.5 MG tablet Take 7.5 mg by mouth as needed for pain.      . montelukast (SINGULAIR) 10 MG tablet Take 10 mg by mouth daily.       Marland Kitchen. morphine (MSIR) 30 MG tablet Take 30 mg by mouth 3 (three) times daily.      . Multiple Vitamin (MULTIVITAMIN) tablet Take 1 tablet by mouth daily.      . Omeprazole Magnesium (PRILOSEC OTC PO) Take by mouth daily.      Marland Kitchen. oxybutynin (DITROPAN-XL) 5 MG 24 hr tablet Take 5 mg by mouth daily.      . propranolol (INDERAL) 10 MG tablet Take 10 mg by mouth daily.      . rivaroxaban (XARELTO) 20 MG TABS tablet Take 1 tablet (20 mg total) by mouth daily.  30 tablet  6  . traMADol (ULTRAM) 50 MG tablet  Take 50-100 mg by mouth every 6 (six) hours as needed.       . verapamil (COVERA HS) 240 MG (CO) 24 hr tablet Take 120mg  (half tablet) in the morning and 240mg  (whole tablet) in the evening.  45 tablet  6  . vitamin C (ASCORBIC ACID) 500 MG tablet Take 500 mg by mouth every other day.        No current facility-administered medications for this visit.    Past Medical History  Diagnosis Date  . Hypertension     History reviewed. No pertinent past surgical history.  History   Social History  . Marital Status: Married    Spouse Name: N/A    Number of Children: N/A  . Years of Education: N/A   Occupational History  . Not on file.   Social History Main Topics  . Smoking status: Never Smoker   . Smokeless tobacco: Never Used  . Alcohol Use: Yes     Comment: Occasional  . Drug Use: Not on file  . Sexual Activity: Not on file   Other Topics Concern  .  Not on file   Social History Narrative  . No narrative on file     Filed Vitals:   10/20/14 1515  BP: 115/76  Pulse: 71  Height: 5\' 4"  (1.626 m)  Weight: 177 lb (80.287 kg)    PHYSICAL EXAM General: NAD HEENT: Normal. Neck: No JVD, no thyromegaly. Lungs: Clear to auscultation bilaterally with normal respiratory effort. CV: Nondisplaced PMI.  Regular rate and rhythm, normal S1/S2, no S3/S4, no murmur. No pretibial or periankle edema.  No carotid bruit.  Normal pedal pulses.  Abdomen: Soft, nontender, no hepatosplenomegaly, no distention.  Neurologic: Alert and oriented x 3.  Psych: Normal affect. Skin: Normal. Musculoskeletal: Normal range of motion, no gross deformities. Extremities: No clubbing or cyanosis.   ECG: Most recent ECG reviewed.      ASSESSMENT AND PLAN: 1. Atrial fibrillation with RVR: She is in sinus rhythm with frequent palpitations. I will continue verapamil 120 mg q am and 240 mg q pm. CHADS-VASC score is 3 (HTN, age, gender), thus putting her at an increased risk for a thromboembolic event.  She has not tolerated Xarelto 20 mg daily. I will d/c ASA and start Eliquis 5 mg bid. Await results of TSH, CBC, free T4, and BMET. Left atrial size is normal as is LV systolic function. She has her husband's metoprolol. I will have her take 25 mg bid and have her call me in 1 month to see if this has helped to alleviate her symptoms, and then determine how best to proceed. 2. Essential HTN: Controlled on present therapy.   3. GERD: On Nexium.   Dispo: f/u 2 months.  Prentice DockerSuresh Tamira Ryland, M.D., F.A.C.C.

## 2014-10-20 NOTE — Patient Instructions (Addendum)
   Stop Xarelto  Begin Eliquis 5mg  twice a day  - samples provided, scripts for free 30 day trial offer & regular 1 month supply + refills given today.  Stop Aspirin  Continue the Metoprolol Tart 25mg  twice a day - call with update in 2 weeks on how doing.   Continue all other medications.   Follow up in 1 month with anticoagulation management.  Follow up in  2 months

## 2014-10-27 ENCOUNTER — Telehealth: Payer: Self-pay | Admitting: *Deleted

## 2014-10-27 NOTE — Telephone Encounter (Signed)
Message left on voice mail - had a medication question.  Attempted to return call - voice mail not set up.

## 2014-11-03 NOTE — Telephone Encounter (Signed)
Left message to return call 

## 2014-11-07 NOTE — Telephone Encounter (Signed)
Attempted to return call again - left message.

## 2014-11-08 NOTE — Telephone Encounter (Signed)
If calling in reference to medication question, disregard. Said she got her Pharmacist to answer

## 2014-11-15 ENCOUNTER — Other Ambulatory Visit: Payer: Self-pay | Admitting: *Deleted

## 2014-11-15 MED ORDER — METOPROLOL TARTRATE 25 MG PO TABS: 25.0000 mg | ORAL_TABLET | Freq: Two times a day (BID) | ORAL | Status: DC

## 2014-12-05 ENCOUNTER — Ambulatory Visit (INDEPENDENT_AMBULATORY_CARE_PROVIDER_SITE_OTHER): Payer: Medicare Other | Admitting: *Deleted

## 2014-12-05 DIAGNOSIS — I4891 Unspecified atrial fibrillation: Secondary | ICD-10-CM

## 2014-12-08 ENCOUNTER — Telehealth: Payer: Self-pay | Admitting: *Deleted

## 2014-12-08 NOTE — Telephone Encounter (Signed)
Call placed to patient to discuss dizziness that was discussed at last anticoagulant visit.  Patient stated that she was having dizziness & weakness.   Stated her BP's have been running normal though.  Offered to reschedule her January appointment with Dr. Purvis SheffieldKoneswaran to week of 12/18/14.  Patient declined due to being out of town.  But, she is scheduled for her physical with her PMD Sherryll Burger(Shah) today & will address these issues while there.  Advised her to call back to reschedule if PMD feels she should be seen sooner.  Patient verbalized understanding.

## 2014-12-13 ENCOUNTER — Telehealth: Payer: Self-pay | Admitting: Cardiovascular Disease

## 2014-12-13 NOTE — Telephone Encounter (Signed)
Spoke with patient and she thinks the metoprolol is causing her to be dizzy. Advised patient to take 12.5 mg twice daily instead of 25 mg to see if this helps the dizziness. Nurse advised patient to call office back next week with response. Patient verbalized understanding of plan.

## 2014-12-13 NOTE — Telephone Encounter (Signed)
Patient continues to have more problems with metoprolol tartrate (LOPRESSOR) 25 MG tablet . Having more dizziness and weakness. She would like to discontinue taking this medication

## 2015-01-09 ENCOUNTER — Ambulatory Visit: Payer: Medicare Other | Admitting: Cardiovascular Disease

## 2015-01-12 ENCOUNTER — Ambulatory Visit: Payer: Medicare Other | Admitting: Cardiovascular Disease

## 2015-01-17 NOTE — Progress Notes (Signed)
Pt was started on Eliquis 5mg  bid for atrial fib on 10/20/14 by Dr Purvis SheffieldKoneswaran.    Reviewed patients medication list.  Pt is not currently on any combined P-gp and strong CYP3A4 inhibitors/inducers (ketoconazole, traconazole, ritonavir, carbamazepine, phenytoin, rifampin, St. John's wort). Reviewed labs from 01/11/15.  SCr 0.86  Weight 81.2kg  Age:69  CrCl 82.49 .  Dose is appropriate based on 2 out of 3 criteria (age,weight,SrCr).  Hgb and HCT:  11.8/36.0  A full discussion of the nature of anticoagulants has been carried out.  A benefit/risk analysis has been presented to the patient, so that they understand the justification for choosing anticoagulation with Eliquis at this time.  The need for compliance is stressed.  Pt is aware to take the medication twice daily.  Side effects of potential bleeding are discussed, including unusual colored urine or stools, coughing up blood or coffee ground emesis, nose bleeds or serious fall or head trauma.  Discussed signs and symptoms of stroke. The patient should avoid any OTC items containing aspirin or ibuprofen.  Avoid alcohol consumption.   Call if any signs of abnormal bleeding.  Discussed financial obligations and resolved any difficulty in obtaining medication.  Next lab test in 6 months.  Pt placed in recall.

## 2015-01-30 ENCOUNTER — Ambulatory Visit: Payer: Medicare Other | Admitting: Cardiovascular Disease

## 2015-01-31 ENCOUNTER — Ambulatory Visit: Payer: Medicare Other | Admitting: Cardiovascular Disease

## 2015-02-06 ENCOUNTER — Ambulatory Visit: Payer: Medicare Other | Admitting: Cardiovascular Disease

## 2015-03-19 ENCOUNTER — Ambulatory Visit: Payer: Medicare Other | Admitting: Cardiovascular Disease

## 2015-05-29 ENCOUNTER — Other Ambulatory Visit: Payer: Self-pay | Admitting: Neurology

## 2015-05-29 DIAGNOSIS — R51 Headache: Principal | ICD-10-CM

## 2015-05-29 DIAGNOSIS — R519 Headache, unspecified: Secondary | ICD-10-CM

## 2015-06-11 ENCOUNTER — Ambulatory Visit (HOSPITAL_COMMUNITY)
Admission: RE | Admit: 2015-06-11 | Discharge: 2015-06-11 | Disposition: A | Discharge status: Home / Self Care | Payer: Medicare Other | Source: Ambulatory Visit | Attending: Neurology | Admitting: Neurology

## 2015-06-11 DIAGNOSIS — R519 Headache, unspecified: Secondary | ICD-10-CM

## 2015-06-11 DIAGNOSIS — R51 Headache: Secondary | ICD-10-CM | POA: Insufficient documentation

## 2015-06-13 ENCOUNTER — Other Ambulatory Visit: Payer: Self-pay | Admitting: Neurology

## 2015-06-13 DIAGNOSIS — G4459 Other complicated headache syndrome: Secondary | ICD-10-CM

## 2015-06-15 ENCOUNTER — Other Ambulatory Visit (HOSPITAL_COMMUNITY): Payer: Medicare Other

## 2015-06-18 ENCOUNTER — Ambulatory Visit: Payer: Medicare Other | Admitting: Cardiovascular Disease

## 2015-06-22 ENCOUNTER — Other Ambulatory Visit: Payer: Self-pay | Admitting: *Deleted

## 2015-06-22 MED ORDER — VERAPAMIL HCL 240 MG (CO) PO TB24
ORAL_TABLET | ORAL | Status: DC
Start: 2015-06-22 — End: 2022-02-20

## 2015-07-05 ENCOUNTER — Ambulatory Visit (INDEPENDENT_AMBULATORY_CARE_PROVIDER_SITE_OTHER): Payer: Medicare Other | Admitting: Cardiovascular Disease

## 2015-07-05 ENCOUNTER — Encounter: Payer: Self-pay | Admitting: Cardiovascular Disease

## 2015-07-05 VITALS — BP 122/88 | HR 73 | Ht 63.0 in | Wt 150.0 lb

## 2015-07-05 DIAGNOSIS — I10 Essential (primary) hypertension: Secondary | ICD-10-CM | POA: Diagnosis not present

## 2015-07-05 DIAGNOSIS — I480 Paroxysmal atrial fibrillation: Secondary | ICD-10-CM

## 2015-07-05 NOTE — Progress Notes (Signed)
Patient ID: Joy Hawkins, female   DOB: Jun 08, 1946, 69 y.o.   MRN: [id number]      SUBJECTIVE: The patient is here to follow-up for atrial fibrillation. She is on verapamil and apixaban. She did not tolerate metoprolol or Xarelto.  She has intermittent palpitations occurring once a month and lasting 2 or 3 days. She has bilateral shoulder and arm pain and does has a history of fibromyalgia for 20 years. She had a UTI last week and had either presyncope or vertigo with falls. She never truly lost consciousness. She was dehydrated and is now feeling better. She is struggling with depression and will soon be undergoing treatment for this, exacerbated after her husband's passing. She said she has struggled with depression for most of her life.   Review of Systems: As per "subjective", otherwise negative.  Allergies  Allergen Reactions  . Biaxin [Clarithromycin]   . Ciprofloxacin   . Cymbalta [Duloxetine Hcl]   . Levaquin [Levofloxacin In D5w]     Throat swelling/rash 07/05/15  . Naproxen   . Penicillins   . Tequin [Gatifloxacin]   . Ultracet [Tramadol-Acetaminophen]   . Vicodin [Hydrocodone-Acetaminophen]     Current Outpatient Prescriptions  Medication Sig Dispense Refill  . albuterol (PROVENTIL HFA;VENTOLIN HFA) 108 (90 BASE) MCG/ACT inhaler Inhale 2 puffs into the lungs every 6 (six) hours as needed for wheezing.    Marland Kitchen. amiloride-hydrochlorothiazide (MODURETIC) 5-50 MG tablet Take 0.5 tablets by mouth daily. Take half tablet daily. 30 tablet 3  . apixaban (ELIQUIS) 5 MG TABS tablet Take 1 tablet (5 mg total) by mouth 2 (two) times daily. 30 tablet 0  . b complex vitamins tablet Take 1 tablet by mouth daily.    . Calcium Citrate (CITRACAL PO) Take 1 tablet by mouth 2 (two) times daily.     . citalopram (CELEXA) 40 MG tablet Take 40 mg by mouth daily.    Marland Kitchen. docusate sodium (COLACE) 100 MG capsule Take 100 mg by mouth daily.    Marland Kitchen. eletriptan (RELPAX) 40 MG tablet Take 40 mg by mouth  2 (two) times daily as needed for migraine or headache. One tablet by mouth at onset of headache. May repeat in 2 hours if headache persists or recurs.    . ergocalciferol (VITAMIN D2) 50000 UNITS capsule Take 50,000 Units by mouth once a week.    . levothyroxine (SYNTHROID, LEVOTHROID) 75 MCG tablet Take 75 mcg by mouth daily before breakfast.    . lubiprostone (AMITIZA) 24 MCG capsule Take 24 mcg by mouth 2 (two) times daily with a meal.    . meloxicam (MOBIC) 7.5 MG tablet Take 7.5 mg by mouth as needed for pain.    . montelukast (SINGULAIR) 10 MG tablet Take 10 mg by mouth daily.     Marland Kitchen. morphine (MSIR) 30 MG tablet Take 30 mg by mouth 3 (three) times daily.    . Multiple Vitamin (MULTIVITAMIN) tablet Take 1 tablet by mouth daily.    . Omeprazole Magnesium (PRILOSEC OTC PO) Take by mouth daily.    Marland Kitchen. oxybutynin (DITROPAN-XL) 5 MG 24 hr tablet Take 5 mg by mouth daily.    . propranolol (INDERAL) 10 MG tablet Take 10 mg by mouth daily.    . traMADol (ULTRAM) 50 MG tablet Take 50-100 mg by mouth every 6 (six) hours as needed.     . verapamil (COVERA HS) 240 MG (CO) 24 hr tablet Take 120mg  (half tablet) in the morning and 240mg  (whole tablet) in the evening.  45 tablet 1  . vitamin C (ASCORBIC ACID) 500 MG tablet Take 500 mg by mouth every other day.      No current facility-administered medications for this visit.    Past Medical History  Diagnosis Date  . Hypertension     No past surgical history on file.  History   Social History  . Marital Status: Married    Spouse Name: N/A  . Number of Children: N/A  . Years of Education: N/A   Occupational History  . Not on file.   Social History Main Topics  . Smoking status: Never Smoker   . Smokeless tobacco: Never Used  . Alcohol Use: Yes     Comment: Occasional  . Drug Use: Not on file  . Sexual Activity: Not on file   Other Topics Concern  . Not on file   Social History Narrative     Filed Vitals:   07/05/15 1515  BP:  122/88  Pulse: 73  Height:  (1.6 m)  Weight: 150 lb (68.04 kg)  SpO2: 99%    PHYSICAL EXAM General: NAD HEENT: Normal. Neck: No JVD, no thyromegaly. Lungs: Clear to auscultation bilaterally with normal respiratory effort. CV: Nondisplaced PMI.  Regular rate and rhythm, normal S1/S2, no S3/S4, no murmur. No pretibial or periankle edema.  No carotid bruit.    Abdomen: Soft, nontender, no distention.  Neurologic: Alert and oriented x 3.  Psych: Normal affect. Skin: Normal. Musculoskeletal: Normal range of motion, no gross deformities. Extremities: No clubbing or cyanosis.   ECG: Most recent ECG reviewed.      ASSESSMENT AND PLAN: 1. Atrial fibrillation: Relatively stable overall. Continue verapamil. CHADS-VASC score is 3 (HTN, age, gender), thus putting her at an increased risk for a thromboembolic event. Hence, will continue Eliquis.  2. Essential HTN: Controlled on present therapy.No changes.   Dispo: f/u 6 months.   Prentice Docker, M.D., F.A.C.C.

## 2015-07-05 NOTE — Patient Instructions (Signed)
Continue all current medications. Your physician wants you to follow up in: 6 months.  You will receive a reminder letter in the mail one-two months in advance.  If you don't receive a letter, please call our office to schedule the follow up appointment   

## 2015-09-26 ENCOUNTER — Telehealth (HOSPITAL_COMMUNITY): Payer: Self-pay | Admitting: *Deleted

## 2015-09-27 ENCOUNTER — Telehealth (HOSPITAL_COMMUNITY): Payer: Self-pay | Admitting: *Deleted

## 2015-10-30 ENCOUNTER — Other Ambulatory Visit: Payer: Self-pay | Admitting: *Deleted

## 2015-10-30 MED ORDER — AMILORIDE-HYDROCHLOROTHIAZIDE 5-50 MG PO TABS: 0.5000 | ORAL_TABLET | Freq: Every day | ORAL | Status: DC

## 2015-10-30 MED ORDER — APIXABAN 5 MG PO TABS: 5.0000 mg | ORAL_TABLET | Freq: Two times a day (BID) | ORAL | Status: DC

## 2015-11-12 ENCOUNTER — Ambulatory Visit (INDEPENDENT_AMBULATORY_CARE_PROVIDER_SITE_OTHER): Payer: Medicare Other | Admitting: Psychiatry

## 2015-11-12 ENCOUNTER — Encounter (HOSPITAL_COMMUNITY): Payer: Self-pay | Admitting: Psychiatry

## 2015-11-12 VITALS — BP 136/77 | HR 72 | Ht 63.0 in | Wt 152.0 lb

## 2015-11-12 DIAGNOSIS — F329 Major depressive disorder, single episode, unspecified: Secondary | ICD-10-CM | POA: Diagnosis not present

## 2015-11-12 DIAGNOSIS — F32A Depression, unspecified: Secondary | ICD-10-CM

## 2015-11-12 MED ORDER — FLUOXETINE HCL 40 MG PO CAPS: 40.0000 mg | ORAL_CAPSULE | Freq: Every day | ORAL | Status: DC
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
## 2015-11-26 ENCOUNTER — Telehealth (HOSPITAL_COMMUNITY): Payer: Self-pay | Admitting: *Deleted

## 2015-11-26 NOTE — Telephone Encounter (Signed)
Please route to [facility], tell her to stop it and come in for appt.

## 2015-11-26 NOTE — Telephone Encounter (Signed)
phone call from patient she had adverse reaction to Prozac.   She is so weak she can barely get around in the house, irregular heart beat.   She said it is not recommended that it be taken with Morphine which she is on.

## 2015-11-27 NOTE — Telephone Encounter (Signed)
Pt is aware and is scheduled to come into office on 11-28-15

## 2015-11-28 ENCOUNTER — Encounter (HOSPITAL_COMMUNITY): Payer: Self-pay | Admitting: Psychiatry

## 2015-11-28 ENCOUNTER — Ambulatory Visit (INDEPENDENT_AMBULATORY_CARE_PROVIDER_SITE_OTHER): Payer: Medicare Other | Admitting: Psychiatry

## 2015-11-28 VITALS — BP 93/58 | HR 75 | Ht 63.0 in | Wt 151.6 lb

## 2015-11-28 DIAGNOSIS — F32A Depression, unspecified: Secondary | ICD-10-CM

## 2015-11-28 DIAGNOSIS — F329 Major depressive disorder, single episode, unspecified: Secondary | ICD-10-CM | POA: Diagnosis not present
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
## 2015-12-19 ENCOUNTER — Telehealth: Payer: Self-pay | Admitting: Cardiovascular Disease

## 2015-12-19 NOTE — Telephone Encounter (Signed)
Mrs. Joy Hawkins called stating that she has started having bleeding in her stools.  She thinks this is coming from Eliquis.  She went to an Urgent Care while out of town and was told to stop the Eliquis and to start an ASA. States that her BP is normal now and that she shows no signs of bleeding in the stool.

## 2015-12-19 NOTE — Telephone Encounter (Signed)
Patient already has 6 mo f/u scheduled for 01/18/16.

## 2015-12-20 ENCOUNTER — Ambulatory Visit (HOSPITAL_COMMUNITY): Payer: Self-pay | Admitting: Psychiatry

## 2015-12-20 NOTE — Telephone Encounter (Signed)
Blood in the stool is indicative of underlying GI pathology. Blood thinners do not cause spontaneous bleeding to occur unless there is something else going on. Stopping Eliquis puts her at an increased risk of stroke. Would have her PCP make a GI referral.

## 2015-12-20 NOTE — Telephone Encounter (Signed)
[id number] 

## 2015-12-26 NOTE — Telephone Encounter (Signed)
Left message to return call 

## 2015-12-28 NOTE — Telephone Encounter (Signed)
3rd attempt to contact patient, left message again.  Reminder on upcoming appointment left on voice mail as well.

## 2015-12-31 ENCOUNTER — Ambulatory Visit (HOSPITAL_COMMUNITY): Payer: Self-pay | Admitting: Psychology

## 2016-01-02 ENCOUNTER — Telehealth (HOSPITAL_COMMUNITY): Payer: Self-pay | Admitting: *Deleted

## 2016-01-03 ENCOUNTER — Telehealth: Payer: Self-pay | Admitting: *Deleted

## 2016-01-03 NOTE — Telephone Encounter (Signed)
Message left on voice mail, returning call.  Had been out of town with death in family & this is first time she had chance to call back.    Relayed message regarding the blood in her stool & risk of being off the Eliquis.  Stated that her bleeding has stopped completely.  She has been taking ASA 81mg  daily.  Also, stated that she does have GI appointment scheduled next month in [place].    She will keep her follow up with Dr. Purvis SheffieldKoneswaran 01/18/16.

## 2016-01-15 ENCOUNTER — Ambulatory Visit (HOSPITAL_COMMUNITY): Payer: Self-pay | Admitting: Psychiatry

## 2016-01-18 ENCOUNTER — Ambulatory Visit: Payer: Medicare Other | Admitting: Cardiovascular Disease

## 2016-01-21 ENCOUNTER — Ambulatory Visit (HOSPITAL_COMMUNITY): Payer: Self-pay | Admitting: Psychology

## 2016-01-30 ENCOUNTER — Ambulatory Visit (HOSPITAL_COMMUNITY): Payer: Self-pay | Admitting: Psychiatry

## 2016-01-30 ENCOUNTER — Encounter (HOSPITAL_COMMUNITY): Payer: Self-pay | Admitting: Psychiatry

## 2016-05-07 DIAGNOSIS — M797 Fibromyalgia: Secondary | ICD-10-CM | POA: Diagnosis not present

## 2016-05-07 DIAGNOSIS — G43711 Chronic migraine without aura, intractable, with status migrainosus: Secondary | ICD-10-CM | POA: Diagnosis not present

## 2016-05-07 DIAGNOSIS — Z79899 Other long term (current) drug therapy: Secondary | ICD-10-CM | POA: Diagnosis not present

## 2016-05-07 DIAGNOSIS — G4710 Hypersomnia, unspecified: Secondary | ICD-10-CM | POA: Diagnosis not present

## 2016-05-07 DIAGNOSIS — G4459 Other complicated headache syndrome: Secondary | ICD-10-CM | POA: Diagnosis not present

## 2016-05-07 DIAGNOSIS — R42 Dizziness and giddiness: Secondary | ICD-10-CM | POA: Diagnosis not present

## 2016-05-07 DIAGNOSIS — E039 Hypothyroidism, unspecified: Secondary | ICD-10-CM | POA: Diagnosis not present

## 2016-05-07 DIAGNOSIS — I10 Essential (primary) hypertension: Secondary | ICD-10-CM | POA: Diagnosis not present

## 2016-05-07 DIAGNOSIS — F329 Major depressive disorder, single episode, unspecified: Secondary | ICD-10-CM | POA: Diagnosis not present

## 2016-05-07 DIAGNOSIS — K219 Gastro-esophageal reflux disease without esophagitis: Secondary | ICD-10-CM | POA: Diagnosis not present

## 2016-05-07 DIAGNOSIS — G894 Chronic pain syndrome: Secondary | ICD-10-CM | POA: Diagnosis not present

## 2016-05-07 DIAGNOSIS — F39 Unspecified mood [affective] disorder: Secondary | ICD-10-CM | POA: Diagnosis not present

## 2016-06-02 DIAGNOSIS — K5732 Diverticulitis of large intestine without perforation or abscess without bleeding: Secondary | ICD-10-CM | POA: Diagnosis not present

## 2016-06-02 DIAGNOSIS — M797 Fibromyalgia: Secondary | ICD-10-CM | POA: Diagnosis not present

## 2016-06-02 DIAGNOSIS — E876 Hypokalemia: Secondary | ICD-10-CM | POA: Diagnosis not present

## 2016-06-02 DIAGNOSIS — R197 Diarrhea, unspecified: Secondary | ICD-10-CM | POA: Diagnosis not present

## 2016-06-02 DIAGNOSIS — R112 Nausea with vomiting, unspecified: Secondary | ICD-10-CM | POA: Diagnosis not present

## 2016-06-02 DIAGNOSIS — K2970 Gastritis, unspecified, without bleeding: Secondary | ICD-10-CM | POA: Diagnosis not present

## 2016-06-02 DIAGNOSIS — E039 Hypothyroidism, unspecified: Secondary | ICD-10-CM | POA: Diagnosis not present

## 2016-06-02 DIAGNOSIS — R1900 Intra-abdominal and pelvic swelling, mass and lump, unspecified site: Secondary | ICD-10-CM | POA: Diagnosis not present

## 2016-06-02 DIAGNOSIS — I10 Essential (primary) hypertension: Secondary | ICD-10-CM | POA: Diagnosis not present

## 2016-06-02 DIAGNOSIS — I480 Paroxysmal atrial fibrillation: Secondary | ICD-10-CM | POA: Diagnosis not present

## 2016-06-02 DIAGNOSIS — K529 Noninfective gastroenteritis and colitis, unspecified: Secondary | ICD-10-CM | POA: Diagnosis not present

## 2016-06-02 DIAGNOSIS — N281 Cyst of kidney, acquired: Secondary | ICD-10-CM | POA: Diagnosis not present

## 2016-06-03 DIAGNOSIS — I10 Essential (primary) hypertension: Secondary | ICD-10-CM | POA: Diagnosis present

## 2016-06-03 DIAGNOSIS — R197 Diarrhea, unspecified: Secondary | ICD-10-CM | POA: Diagnosis not present

## 2016-06-03 DIAGNOSIS — Z881 Allergy status to other antibiotic agents status: Secondary | ICD-10-CM | POA: Diagnosis not present

## 2016-06-03 DIAGNOSIS — Z882 Allergy status to sulfonamides status: Secondary | ICD-10-CM | POA: Diagnosis not present

## 2016-06-03 DIAGNOSIS — Z9109 Other allergy status, other than to drugs and biological substances: Secondary | ICD-10-CM | POA: Diagnosis not present

## 2016-06-03 DIAGNOSIS — Z880 Allergy status to penicillin: Secondary | ICD-10-CM | POA: Diagnosis not present

## 2016-06-03 DIAGNOSIS — F329 Major depressive disorder, single episode, unspecified: Secondary | ICD-10-CM | POA: Diagnosis present

## 2016-06-03 DIAGNOSIS — I4891 Unspecified atrial fibrillation: Secondary | ICD-10-CM | POA: Diagnosis not present

## 2016-06-03 DIAGNOSIS — Z79899 Other long term (current) drug therapy: Secondary | ICD-10-CM | POA: Diagnosis not present

## 2016-06-03 DIAGNOSIS — Z7401 Bed confinement status: Secondary | ICD-10-CM | POA: Diagnosis not present

## 2016-06-03 DIAGNOSIS — R279 Unspecified lack of coordination: Secondary | ICD-10-CM | POA: Diagnosis not present

## 2016-06-03 DIAGNOSIS — M797 Fibromyalgia: Secondary | ICD-10-CM | POA: Diagnosis present

## 2016-06-03 DIAGNOSIS — E039 Hypothyroidism, unspecified: Secondary | ICD-10-CM | POA: Diagnosis present

## 2016-06-03 DIAGNOSIS — N281 Cyst of kidney, acquired: Secondary | ICD-10-CM | POA: Diagnosis not present

## 2016-06-03 DIAGNOSIS — R262 Difficulty in walking, not elsewhere classified: Secondary | ICD-10-CM | POA: Diagnosis not present

## 2016-06-03 DIAGNOSIS — Z7982 Long term (current) use of aspirin: Secondary | ICD-10-CM | POA: Diagnosis not present

## 2016-06-03 DIAGNOSIS — I480 Paroxysmal atrial fibrillation: Secondary | ICD-10-CM | POA: Diagnosis not present

## 2016-06-03 DIAGNOSIS — G43909 Migraine, unspecified, not intractable, without status migrainosus: Secondary | ICD-10-CM | POA: Diagnosis not present

## 2016-06-03 DIAGNOSIS — E876 Hypokalemia: Secondary | ICD-10-CM | POA: Diagnosis present

## 2016-06-03 DIAGNOSIS — K219 Gastro-esophageal reflux disease without esophagitis: Secondary | ICD-10-CM | POA: Diagnosis present

## 2016-06-03 DIAGNOSIS — G4733 Obstructive sleep apnea (adult) (pediatric): Secondary | ICD-10-CM | POA: Diagnosis present

## 2016-06-03 DIAGNOSIS — Z8249 Family history of ischemic heart disease and other diseases of the circulatory system: Secondary | ICD-10-CM | POA: Diagnosis not present

## 2016-06-03 DIAGNOSIS — G2581 Restless legs syndrome: Secondary | ICD-10-CM | POA: Diagnosis present

## 2016-06-03 DIAGNOSIS — Z886 Allergy status to analgesic agent status: Secondary | ICD-10-CM | POA: Diagnosis not present

## 2016-06-03 DIAGNOSIS — Z888 Allergy status to other drugs, medicaments and biological substances status: Secondary | ICD-10-CM | POA: Diagnosis not present

## 2016-06-03 DIAGNOSIS — J45909 Unspecified asthma, uncomplicated: Secondary | ICD-10-CM | POA: Diagnosis present

## 2016-06-03 DIAGNOSIS — Z809 Family history of malignant neoplasm, unspecified: Secondary | ICD-10-CM | POA: Diagnosis not present

## 2016-06-03 DIAGNOSIS — M6281 Muscle weakness (generalized): Secondary | ICD-10-CM | POA: Diagnosis not present

## 2016-06-03 DIAGNOSIS — K5732 Diverticulitis of large intestine without perforation or abscess without bleeding: Secondary | ICD-10-CM | POA: Diagnosis present

## 2016-06-07 DIAGNOSIS — G43909 Migraine, unspecified, not intractable, without status migrainosus: Secondary | ICD-10-CM | POA: Diagnosis not present

## 2016-06-07 DIAGNOSIS — E039 Hypothyroidism, unspecified: Secondary | ICD-10-CM | POA: Diagnosis not present

## 2016-06-07 DIAGNOSIS — I480 Paroxysmal atrial fibrillation: Secondary | ICD-10-CM | POA: Diagnosis not present

## 2016-06-07 DIAGNOSIS — M6281 Muscle weakness (generalized): Secondary | ICD-10-CM | POA: Diagnosis not present

## 2016-06-07 DIAGNOSIS — G2581 Restless legs syndrome: Secondary | ICD-10-CM | POA: Diagnosis not present

## 2016-06-07 DIAGNOSIS — Z7401 Bed confinement status: Secondary | ICD-10-CM | POA: Diagnosis not present

## 2016-06-07 DIAGNOSIS — G4733 Obstructive sleep apnea (adult) (pediatric): Secondary | ICD-10-CM | POA: Diagnosis not present

## 2016-06-07 DIAGNOSIS — K219 Gastro-esophageal reflux disease without esophagitis: Secondary | ICD-10-CM | POA: Diagnosis not present

## 2016-06-07 DIAGNOSIS — M797 Fibromyalgia: Secondary | ICD-10-CM | POA: Diagnosis not present

## 2016-06-07 DIAGNOSIS — Z880 Allergy status to penicillin: Secondary | ICD-10-CM | POA: Diagnosis not present

## 2016-06-07 DIAGNOSIS — R262 Difficulty in walking, not elsewhere classified: Secondary | ICD-10-CM | POA: Diagnosis not present

## 2016-06-07 DIAGNOSIS — F329 Major depressive disorder, single episode, unspecified: Secondary | ICD-10-CM | POA: Diagnosis not present

## 2016-06-07 DIAGNOSIS — Z888 Allergy status to other drugs, medicaments and biological substances status: Secondary | ICD-10-CM | POA: Diagnosis not present

## 2016-06-07 DIAGNOSIS — Z886 Allergy status to analgesic agent status: Secondary | ICD-10-CM | POA: Diagnosis not present

## 2016-06-07 DIAGNOSIS — E876 Hypokalemia: Secondary | ICD-10-CM | POA: Diagnosis not present

## 2016-06-07 DIAGNOSIS — I10 Essential (primary) hypertension: Secondary | ICD-10-CM | POA: Diagnosis not present

## 2016-06-07 DIAGNOSIS — Z881 Allergy status to other antibiotic agents status: Secondary | ICD-10-CM | POA: Diagnosis not present

## 2016-06-07 DIAGNOSIS — J45909 Unspecified asthma, uncomplicated: Secondary | ICD-10-CM | POA: Diagnosis not present

## 2016-06-07 DIAGNOSIS — K5732 Diverticulitis of large intestine without perforation or abscess without bleeding: Secondary | ICD-10-CM | POA: Diagnosis not present

## 2016-06-07 DIAGNOSIS — I4891 Unspecified atrial fibrillation: Secondary | ICD-10-CM | POA: Diagnosis not present

## 2016-06-07 DIAGNOSIS — R279 Unspecified lack of coordination: Secondary | ICD-10-CM | POA: Diagnosis not present

## 2016-06-08 DIAGNOSIS — K5732 Diverticulitis of large intestine without perforation or abscess without bleeding: Secondary | ICD-10-CM | POA: Diagnosis not present

## 2016-06-08 DIAGNOSIS — E876 Hypokalemia: Secondary | ICD-10-CM | POA: Diagnosis not present

## 2016-07-14 DIAGNOSIS — M797 Fibromyalgia: Secondary | ICD-10-CM | POA: Diagnosis not present

## 2016-07-14 DIAGNOSIS — I10 Essential (primary) hypertension: Secondary | ICD-10-CM | POA: Diagnosis not present

## 2016-07-16 DIAGNOSIS — G43909 Migraine, unspecified, not intractable, without status migrainosus: Secondary | ICD-10-CM | POA: Diagnosis not present

## 2016-07-16 DIAGNOSIS — M6281 Muscle weakness (generalized): Secondary | ICD-10-CM | POA: Diagnosis not present

## 2016-07-16 DIAGNOSIS — R2681 Unsteadiness on feet: Secondary | ICD-10-CM | POA: Diagnosis not present

## 2016-07-16 DIAGNOSIS — I4891 Unspecified atrial fibrillation: Secondary | ICD-10-CM | POA: Diagnosis not present

## 2016-07-16 DIAGNOSIS — G250 Essential tremor: Secondary | ICD-10-CM | POA: Diagnosis not present

## 2016-07-16 DIAGNOSIS — F329 Major depressive disorder, single episode, unspecified: Secondary | ICD-10-CM | POA: Diagnosis not present

## 2016-07-16 DIAGNOSIS — I10 Essential (primary) hypertension: Secondary | ICD-10-CM | POA: Diagnosis not present

## 2016-07-16 DIAGNOSIS — K5732 Diverticulitis of large intestine without perforation or abscess without bleeding: Secondary | ICD-10-CM | POA: Diagnosis not present

## 2016-07-16 DIAGNOSIS — Z7982 Long term (current) use of aspirin: Secondary | ICD-10-CM | POA: Diagnosis not present

## 2016-07-16 DIAGNOSIS — J45909 Unspecified asthma, uncomplicated: Secondary | ICD-10-CM | POA: Diagnosis not present

## 2016-07-16 DIAGNOSIS — Z9181 History of falling: Secondary | ICD-10-CM | POA: Diagnosis not present

## 2016-07-18 DIAGNOSIS — F329 Major depressive disorder, single episode, unspecified: Secondary | ICD-10-CM | POA: Diagnosis not present

## 2016-07-18 DIAGNOSIS — G250 Essential tremor: Secondary | ICD-10-CM | POA: Diagnosis not present

## 2016-07-18 DIAGNOSIS — K5732 Diverticulitis of large intestine without perforation or abscess without bleeding: Secondary | ICD-10-CM | POA: Diagnosis not present

## 2016-07-18 DIAGNOSIS — I10 Essential (primary) hypertension: Secondary | ICD-10-CM | POA: Diagnosis not present

## 2016-07-18 DIAGNOSIS — I4891 Unspecified atrial fibrillation: Secondary | ICD-10-CM | POA: Diagnosis not present

## 2016-07-18 DIAGNOSIS — M6281 Muscle weakness (generalized): Secondary | ICD-10-CM | POA: Diagnosis not present

## 2016-07-22 DIAGNOSIS — F329 Major depressive disorder, single episode, unspecified: Secondary | ICD-10-CM | POA: Diagnosis not present

## 2016-07-22 DIAGNOSIS — I10 Essential (primary) hypertension: Secondary | ICD-10-CM | POA: Diagnosis not present

## 2016-07-22 DIAGNOSIS — M6281 Muscle weakness (generalized): Secondary | ICD-10-CM | POA: Diagnosis not present

## 2016-07-22 DIAGNOSIS — G250 Essential tremor: Secondary | ICD-10-CM | POA: Diagnosis not present

## 2016-07-22 DIAGNOSIS — K5732 Diverticulitis of large intestine without perforation or abscess without bleeding: Secondary | ICD-10-CM | POA: Diagnosis not present

## 2016-07-22 DIAGNOSIS — I4891 Unspecified atrial fibrillation: Secondary | ICD-10-CM | POA: Diagnosis not present

## 2016-07-23 DIAGNOSIS — G250 Essential tremor: Secondary | ICD-10-CM | POA: Diagnosis not present

## 2016-07-23 DIAGNOSIS — M6281 Muscle weakness (generalized): Secondary | ICD-10-CM | POA: Diagnosis not present

## 2016-07-23 DIAGNOSIS — F329 Major depressive disorder, single episode, unspecified: Secondary | ICD-10-CM | POA: Diagnosis not present

## 2016-07-23 DIAGNOSIS — I10 Essential (primary) hypertension: Secondary | ICD-10-CM | POA: Diagnosis not present

## 2016-07-23 DIAGNOSIS — K5732 Diverticulitis of large intestine without perforation or abscess without bleeding: Secondary | ICD-10-CM | POA: Diagnosis not present

## 2016-07-23 DIAGNOSIS — I4891 Unspecified atrial fibrillation: Secondary | ICD-10-CM | POA: Diagnosis not present

## 2016-07-25 DIAGNOSIS — M6281 Muscle weakness (generalized): Secondary | ICD-10-CM | POA: Diagnosis not present

## 2016-07-25 DIAGNOSIS — I4891 Unspecified atrial fibrillation: Secondary | ICD-10-CM | POA: Diagnosis not present

## 2016-07-25 DIAGNOSIS — F329 Major depressive disorder, single episode, unspecified: Secondary | ICD-10-CM | POA: Diagnosis not present

## 2016-07-25 DIAGNOSIS — G250 Essential tremor: Secondary | ICD-10-CM | POA: Diagnosis not present

## 2016-07-25 DIAGNOSIS — K5732 Diverticulitis of large intestine without perforation or abscess without bleeding: Secondary | ICD-10-CM | POA: Diagnosis not present

## 2016-07-25 DIAGNOSIS — I10 Essential (primary) hypertension: Secondary | ICD-10-CM | POA: Diagnosis not present

## 2016-07-28 DIAGNOSIS — F329 Major depressive disorder, single episode, unspecified: Secondary | ICD-10-CM | POA: Diagnosis not present

## 2016-07-28 DIAGNOSIS — I10 Essential (primary) hypertension: Secondary | ICD-10-CM | POA: Diagnosis not present

## 2016-07-28 DIAGNOSIS — Z6829 Body mass index (BMI) 29.0-29.9, adult: Secondary | ICD-10-CM | POA: Diagnosis not present

## 2016-07-28 DIAGNOSIS — J069 Acute upper respiratory infection, unspecified: Secondary | ICD-10-CM | POA: Diagnosis not present

## 2016-07-28 DIAGNOSIS — Z789 Other specified health status: Secondary | ICD-10-CM | POA: Diagnosis not present

## 2016-07-28 DIAGNOSIS — M25569 Pain in unspecified knee: Secondary | ICD-10-CM | POA: Diagnosis not present

## 2016-07-28 DIAGNOSIS — Z299 Encounter for prophylactic measures, unspecified: Secondary | ICD-10-CM | POA: Diagnosis not present

## 2016-07-29 DIAGNOSIS — I4891 Unspecified atrial fibrillation: Secondary | ICD-10-CM | POA: Diagnosis not present

## 2016-07-29 DIAGNOSIS — G250 Essential tremor: Secondary | ICD-10-CM | POA: Diagnosis not present

## 2016-07-29 DIAGNOSIS — F329 Major depressive disorder, single episode, unspecified: Secondary | ICD-10-CM | POA: Diagnosis not present

## 2016-07-29 DIAGNOSIS — K5732 Diverticulitis of large intestine without perforation or abscess without bleeding: Secondary | ICD-10-CM | POA: Diagnosis not present

## 2016-07-29 DIAGNOSIS — I10 Essential (primary) hypertension: Secondary | ICD-10-CM | POA: Diagnosis not present

## 2016-07-29 DIAGNOSIS — M6281 Muscle weakness (generalized): Secondary | ICD-10-CM | POA: Diagnosis not present

## 2016-08-04 DIAGNOSIS — F329 Major depressive disorder, single episode, unspecified: Secondary | ICD-10-CM | POA: Diagnosis not present

## 2016-08-04 DIAGNOSIS — K5732 Diverticulitis of large intestine without perforation or abscess without bleeding: Secondary | ICD-10-CM | POA: Diagnosis not present

## 2016-08-04 DIAGNOSIS — G250 Essential tremor: Secondary | ICD-10-CM | POA: Diagnosis not present

## 2016-08-04 DIAGNOSIS — M6281 Muscle weakness (generalized): Secondary | ICD-10-CM | POA: Diagnosis not present

## 2016-08-04 DIAGNOSIS — I4891 Unspecified atrial fibrillation: Secondary | ICD-10-CM | POA: Diagnosis not present

## 2016-08-04 DIAGNOSIS — I10 Essential (primary) hypertension: Secondary | ICD-10-CM | POA: Diagnosis not present

## 2016-08-07 DIAGNOSIS — F329 Major depressive disorder, single episode, unspecified: Secondary | ICD-10-CM | POA: Diagnosis not present

## 2016-08-07 DIAGNOSIS — M6281 Muscle weakness (generalized): Secondary | ICD-10-CM | POA: Diagnosis not present

## 2016-08-07 DIAGNOSIS — G250 Essential tremor: Secondary | ICD-10-CM | POA: Diagnosis not present

## 2016-08-07 DIAGNOSIS — K5732 Diverticulitis of large intestine without perforation or abscess without bleeding: Secondary | ICD-10-CM | POA: Diagnosis not present

## 2016-08-07 DIAGNOSIS — I4891 Unspecified atrial fibrillation: Secondary | ICD-10-CM | POA: Diagnosis not present

## 2016-08-07 DIAGNOSIS — I10 Essential (primary) hypertension: Secondary | ICD-10-CM | POA: Diagnosis not present

## 2016-08-13 DIAGNOSIS — M6281 Muscle weakness (generalized): Secondary | ICD-10-CM | POA: Diagnosis not present

## 2016-08-13 DIAGNOSIS — F329 Major depressive disorder, single episode, unspecified: Secondary | ICD-10-CM | POA: Diagnosis not present

## 2016-08-13 DIAGNOSIS — K5732 Diverticulitis of large intestine without perforation or abscess without bleeding: Secondary | ICD-10-CM | POA: Diagnosis not present

## 2016-08-13 DIAGNOSIS — G250 Essential tremor: Secondary | ICD-10-CM | POA: Diagnosis not present

## 2016-08-13 DIAGNOSIS — I4891 Unspecified atrial fibrillation: Secondary | ICD-10-CM | POA: Diagnosis not present

## 2016-08-13 DIAGNOSIS — I10 Essential (primary) hypertension: Secondary | ICD-10-CM | POA: Diagnosis not present

## 2016-08-14 DIAGNOSIS — M6281 Muscle weakness (generalized): Secondary | ICD-10-CM | POA: Diagnosis not present

## 2016-08-14 DIAGNOSIS — I10 Essential (primary) hypertension: Secondary | ICD-10-CM | POA: Diagnosis not present

## 2016-08-14 DIAGNOSIS — G250 Essential tremor: Secondary | ICD-10-CM | POA: Diagnosis not present

## 2016-08-14 DIAGNOSIS — K5732 Diverticulitis of large intestine without perforation or abscess without bleeding: Secondary | ICD-10-CM | POA: Diagnosis not present

## 2016-08-14 DIAGNOSIS — I4891 Unspecified atrial fibrillation: Secondary | ICD-10-CM | POA: Diagnosis not present

## 2016-08-14 DIAGNOSIS — F329 Major depressive disorder, single episode, unspecified: Secondary | ICD-10-CM | POA: Diagnosis not present

## 2016-09-04 DIAGNOSIS — I10 Essential (primary) hypertension: Secondary | ICD-10-CM | POA: Diagnosis not present

## 2016-09-04 DIAGNOSIS — G250 Essential tremor: Secondary | ICD-10-CM | POA: Diagnosis not present

## 2016-09-04 DIAGNOSIS — F329 Major depressive disorder, single episode, unspecified: Secondary | ICD-10-CM | POA: Diagnosis not present

## 2016-09-04 DIAGNOSIS — K5732 Diverticulitis of large intestine without perforation or abscess without bleeding: Secondary | ICD-10-CM | POA: Diagnosis not present

## 2016-09-04 DIAGNOSIS — I4891 Unspecified atrial fibrillation: Secondary | ICD-10-CM | POA: Diagnosis not present

## 2016-09-04 DIAGNOSIS — M6281 Muscle weakness (generalized): Secondary | ICD-10-CM | POA: Diagnosis not present

## 2016-09-16 DIAGNOSIS — Z1389 Encounter for screening for other disorder: Secondary | ICD-10-CM | POA: Diagnosis not present

## 2016-09-16 DIAGNOSIS — Z7189 Other specified counseling: Secondary | ICD-10-CM | POA: Diagnosis not present

## 2016-09-16 DIAGNOSIS — Z6829 Body mass index (BMI) 29.0-29.9, adult: Secondary | ICD-10-CM | POA: Diagnosis not present

## 2016-09-16 DIAGNOSIS — Z0000 Encounter for general adult medical examination without abnormal findings: Secondary | ICD-10-CM | POA: Diagnosis not present

## 2016-09-16 DIAGNOSIS — Z299 Encounter for prophylactic measures, unspecified: Secondary | ICD-10-CM | POA: Diagnosis not present

## 2016-10-13 DIAGNOSIS — Z23 Encounter for immunization: Secondary | ICD-10-CM | POA: Diagnosis not present

## 2016-10-13 DIAGNOSIS — R42 Dizziness and giddiness: Secondary | ICD-10-CM | POA: Diagnosis not present

## 2016-10-13 DIAGNOSIS — G894 Chronic pain syndrome: Secondary | ICD-10-CM | POA: Diagnosis not present

## 2016-10-13 DIAGNOSIS — G4459 Other complicated headache syndrome: Secondary | ICD-10-CM | POA: Diagnosis not present

## 2016-10-13 DIAGNOSIS — E039 Hypothyroidism, unspecified: Secondary | ICD-10-CM | POA: Diagnosis not present

## 2016-10-13 DIAGNOSIS — I10 Essential (primary) hypertension: Secondary | ICD-10-CM | POA: Diagnosis not present

## 2016-10-13 DIAGNOSIS — G4710 Hypersomnia, unspecified: Secondary | ICD-10-CM | POA: Diagnosis not present

## 2016-10-13 DIAGNOSIS — F39 Unspecified mood [affective] disorder: Secondary | ICD-10-CM | POA: Diagnosis not present

## 2016-10-13 DIAGNOSIS — F329 Major depressive disorder, single episode, unspecified: Secondary | ICD-10-CM | POA: Diagnosis not present

## 2016-10-13 DIAGNOSIS — K219 Gastro-esophageal reflux disease without esophagitis: Secondary | ICD-10-CM | POA: Diagnosis not present

## 2016-10-13 DIAGNOSIS — M797 Fibromyalgia: Secondary | ICD-10-CM | POA: Diagnosis not present

## 2016-10-13 DIAGNOSIS — Z79899 Other long term (current) drug therapy: Secondary | ICD-10-CM | POA: Diagnosis not present

## 2016-10-13 DIAGNOSIS — G43711 Chronic migraine without aura, intractable, with status migrainosus: Secondary | ICD-10-CM | POA: Diagnosis not present

## 2016-11-06 DIAGNOSIS — E2839 Other primary ovarian failure: Secondary | ICD-10-CM | POA: Diagnosis not present

## 2016-11-06 DIAGNOSIS — M8580 Other specified disorders of bone density and structure, unspecified site: Secondary | ICD-10-CM | POA: Diagnosis not present

## 2016-11-07 DIAGNOSIS — S2220XA Unspecified fracture of sternum, initial encounter for closed fracture: Secondary | ICD-10-CM | POA: Diagnosis not present

## 2016-11-07 DIAGNOSIS — E039 Hypothyroidism, unspecified: Secondary | ICD-10-CM | POA: Diagnosis not present

## 2016-11-07 DIAGNOSIS — S27321A Contusion of lung, unilateral, initial encounter: Secondary | ICD-10-CM | POA: Diagnosis not present

## 2016-11-07 DIAGNOSIS — Z880 Allergy status to penicillin: Secondary | ICD-10-CM | POA: Diagnosis not present

## 2016-11-07 DIAGNOSIS — Z888 Allergy status to other drugs, medicaments and biological substances status: Secondary | ICD-10-CM | POA: Diagnosis not present

## 2016-11-07 DIAGNOSIS — R079 Chest pain, unspecified: Secondary | ICD-10-CM | POA: Diagnosis not present

## 2016-11-07 DIAGNOSIS — S2231XA Fracture of one rib, right side, initial encounter for closed fracture: Secondary | ICD-10-CM | POA: Diagnosis not present

## 2016-11-07 DIAGNOSIS — M25512 Pain in left shoulder: Secondary | ICD-10-CM | POA: Diagnosis not present

## 2016-11-07 DIAGNOSIS — S0990XA Unspecified injury of head, initial encounter: Secondary | ICD-10-CM | POA: Diagnosis not present

## 2016-11-07 DIAGNOSIS — J45909 Unspecified asthma, uncomplicated: Secondary | ICD-10-CM | POA: Diagnosis not present

## 2016-11-07 DIAGNOSIS — S2241XA Multiple fractures of ribs, right side, initial encounter for closed fracture: Secondary | ICD-10-CM | POA: Diagnosis not present

## 2016-11-07 DIAGNOSIS — Z881 Allergy status to other antibiotic agents status: Secondary | ICD-10-CM | POA: Diagnosis not present

## 2016-11-07 DIAGNOSIS — R109 Unspecified abdominal pain: Secondary | ICD-10-CM | POA: Diagnosis not present

## 2016-11-07 DIAGNOSIS — M797 Fibromyalgia: Secondary | ICD-10-CM | POA: Diagnosis not present

## 2016-11-07 DIAGNOSIS — I4891 Unspecified atrial fibrillation: Secondary | ICD-10-CM | POA: Diagnosis not present

## 2016-11-07 DIAGNOSIS — S3993XA Unspecified injury of pelvis, initial encounter: Secondary | ICD-10-CM | POA: Diagnosis not present

## 2016-11-07 DIAGNOSIS — K219 Gastro-esophageal reflux disease without esophagitis: Secondary | ICD-10-CM | POA: Diagnosis not present

## 2016-11-07 DIAGNOSIS — Z23 Encounter for immunization: Secondary | ICD-10-CM | POA: Diagnosis not present

## 2016-11-07 DIAGNOSIS — S3991XA Unspecified injury of abdomen, initial encounter: Secondary | ICD-10-CM | POA: Diagnosis not present

## 2016-11-07 DIAGNOSIS — M549 Dorsalgia, unspecified: Secondary | ICD-10-CM | POA: Diagnosis not present

## 2016-11-07 DIAGNOSIS — I10 Essential (primary) hypertension: Secondary | ICD-10-CM | POA: Diagnosis not present

## 2016-11-08 DIAGNOSIS — R079 Chest pain, unspecified: Secondary | ICD-10-CM | POA: Diagnosis not present

## 2016-11-08 DIAGNOSIS — S20219A Contusion of unspecified front wall of thorax, initial encounter: Secondary | ICD-10-CM | POA: Diagnosis present

## 2016-11-08 DIAGNOSIS — R51 Headache: Secondary | ICD-10-CM | POA: Diagnosis not present

## 2016-11-08 DIAGNOSIS — S2220XA Unspecified fracture of sternum, initial encounter for closed fracture: Secondary | ICD-10-CM | POA: Diagnosis present

## 2016-11-08 DIAGNOSIS — S2241XA Multiple fractures of ribs, right side, initial encounter for closed fracture: Secondary | ICD-10-CM | POA: Diagnosis not present

## 2016-11-08 DIAGNOSIS — S0990XA Unspecified injury of head, initial encounter: Secondary | ICD-10-CM | POA: Diagnosis not present

## 2016-11-08 DIAGNOSIS — M797 Fibromyalgia: Secondary | ICD-10-CM | POA: Diagnosis present

## 2016-11-08 DIAGNOSIS — S2243XA Multiple fractures of ribs, bilateral, initial encounter for closed fracture: Secondary | ICD-10-CM | POA: Diagnosis present

## 2016-11-08 DIAGNOSIS — R109 Unspecified abdominal pain: Secondary | ICD-10-CM | POA: Diagnosis not present

## 2016-11-08 DIAGNOSIS — M549 Dorsalgia, unspecified: Secondary | ICD-10-CM | POA: Diagnosis not present

## 2016-11-18 DIAGNOSIS — Z299 Encounter for prophylactic measures, unspecified: Secondary | ICD-10-CM | POA: Diagnosis not present

## 2016-11-18 DIAGNOSIS — M8580 Other specified disorders of bone density and structure, unspecified site: Secondary | ICD-10-CM | POA: Diagnosis not present

## 2016-11-18 DIAGNOSIS — Z6831 Body mass index (BMI) 31.0-31.9, adult: Secondary | ICD-10-CM | POA: Diagnosis not present

## 2016-11-18 DIAGNOSIS — F329 Major depressive disorder, single episode, unspecified: Secondary | ICD-10-CM | POA: Diagnosis not present

## 2016-11-18 DIAGNOSIS — I10 Essential (primary) hypertension: Secondary | ICD-10-CM | POA: Diagnosis not present

## 2016-12-17 ENCOUNTER — Other Ambulatory Visit: Payer: Self-pay | Admitting: Cardiovascular Disease

## 2017-03-11 DIAGNOSIS — R21 Rash and other nonspecific skin eruption: Secondary | ICD-10-CM | POA: Diagnosis not present

## 2017-03-11 DIAGNOSIS — Z6833 Body mass index (BMI) 33.0-33.9, adult: Secondary | ICD-10-CM | POA: Diagnosis not present

## 2017-03-11 DIAGNOSIS — M797 Fibromyalgia: Secondary | ICD-10-CM | POA: Diagnosis not present

## 2017-03-11 DIAGNOSIS — E876 Hypokalemia: Secondary | ICD-10-CM | POA: Diagnosis not present

## 2017-03-11 DIAGNOSIS — I10 Essential (primary) hypertension: Secondary | ICD-10-CM | POA: Diagnosis not present

## 2017-03-11 DIAGNOSIS — Z299 Encounter for prophylactic measures, unspecified: Secondary | ICD-10-CM | POA: Diagnosis not present

## 2017-03-11 DIAGNOSIS — Z713 Dietary counseling and surveillance: Secondary | ICD-10-CM | POA: Diagnosis not present

## 2017-03-25 DIAGNOSIS — J069 Acute upper respiratory infection, unspecified: Secondary | ICD-10-CM | POA: Diagnosis not present

## 2017-03-25 DIAGNOSIS — Z299 Encounter for prophylactic measures, unspecified: Secondary | ICD-10-CM | POA: Diagnosis not present

## 2017-03-25 DIAGNOSIS — I4891 Unspecified atrial fibrillation: Secondary | ICD-10-CM | POA: Diagnosis not present

## 2017-03-25 DIAGNOSIS — Z6835 Body mass index (BMI) 35.0-35.9, adult: Secondary | ICD-10-CM | POA: Diagnosis not present

## 2017-03-25 DIAGNOSIS — Z713 Dietary counseling and surveillance: Secondary | ICD-10-CM | POA: Diagnosis not present

## 2017-04-08 DIAGNOSIS — N6001 Solitary cyst of right breast: Secondary | ICD-10-CM | POA: Diagnosis not present

## 2017-04-08 DIAGNOSIS — N6489 Other specified disorders of breast: Secondary | ICD-10-CM | POA: Diagnosis not present

## 2017-04-08 DIAGNOSIS — R928 Other abnormal and inconclusive findings on diagnostic imaging of breast: Secondary | ICD-10-CM | POA: Diagnosis not present

## 2017-05-04 DIAGNOSIS — G4710 Hypersomnia, unspecified: Secondary | ICD-10-CM | POA: Diagnosis not present

## 2017-05-04 DIAGNOSIS — E039 Hypothyroidism, unspecified: Secondary | ICD-10-CM | POA: Diagnosis not present

## 2017-05-04 DIAGNOSIS — M797 Fibromyalgia: Secondary | ICD-10-CM | POA: Diagnosis not present

## 2017-05-04 DIAGNOSIS — G43711 Chronic migraine without aura, intractable, with status migrainosus: Secondary | ICD-10-CM | POA: Diagnosis not present

## 2017-05-04 DIAGNOSIS — I10 Essential (primary) hypertension: Secondary | ICD-10-CM | POA: Diagnosis not present

## 2017-05-04 DIAGNOSIS — G894 Chronic pain syndrome: Secondary | ICD-10-CM | POA: Diagnosis not present

## 2017-05-04 DIAGNOSIS — Z79899 Other long term (current) drug therapy: Secondary | ICD-10-CM | POA: Diagnosis not present

## 2017-05-04 DIAGNOSIS — G4459 Other complicated headache syndrome: Secondary | ICD-10-CM | POA: Diagnosis not present

## 2017-06-23 ENCOUNTER — Encounter (HOSPITAL_BASED_OUTPATIENT_CLINIC_OR_DEPARTMENT_OTHER): Payer: Self-pay

## 2017-06-23 DIAGNOSIS — G4733 Obstructive sleep apnea (adult) (pediatric): Secondary | ICD-10-CM

## 2017-08-18 DIAGNOSIS — E669 Obesity, unspecified: Secondary | ICD-10-CM | POA: Diagnosis not present

## 2017-08-18 DIAGNOSIS — R05 Cough: Secondary | ICD-10-CM | POA: Diagnosis not present

## 2017-08-18 DIAGNOSIS — Z299 Encounter for prophylactic measures, unspecified: Secondary | ICD-10-CM | POA: Diagnosis not present

## 2017-08-18 DIAGNOSIS — I4891 Unspecified atrial fibrillation: Secondary | ICD-10-CM | POA: Diagnosis not present

## 2017-08-18 DIAGNOSIS — Z789 Other specified health status: Secondary | ICD-10-CM | POA: Diagnosis not present

## 2017-08-18 DIAGNOSIS — E7800 Pure hypercholesterolemia, unspecified: Secondary | ICD-10-CM | POA: Diagnosis not present

## 2017-08-18 DIAGNOSIS — R0600 Dyspnea, unspecified: Secondary | ICD-10-CM | POA: Diagnosis not present

## 2017-08-19 ENCOUNTER — Ambulatory Visit: Payer: Medicare Other | Attending: Neurology | Admitting: Neurology

## 2017-08-19 DIAGNOSIS — G894 Chronic pain syndrome: Secondary | ICD-10-CM | POA: Diagnosis not present

## 2017-08-19 DIAGNOSIS — Z79899 Other long term (current) drug therapy: Secondary | ICD-10-CM | POA: Diagnosis not present

## 2017-08-19 DIAGNOSIS — G4710 Hypersomnia, unspecified: Secondary | ICD-10-CM | POA: Diagnosis not present

## 2017-08-19 DIAGNOSIS — E039 Hypothyroidism, unspecified: Secondary | ICD-10-CM | POA: Diagnosis not present

## 2017-08-19 DIAGNOSIS — G4733 Obstructive sleep apnea (adult) (pediatric): Secondary | ICD-10-CM | POA: Diagnosis not present

## 2017-08-19 DIAGNOSIS — G4459 Other complicated headache syndrome: Secondary | ICD-10-CM | POA: Diagnosis not present

## 2017-08-19 DIAGNOSIS — G43711 Chronic migraine without aura, intractable, with status migrainosus: Secondary | ICD-10-CM | POA: Diagnosis not present

## 2017-08-19 DIAGNOSIS — M797 Fibromyalgia: Secondary | ICD-10-CM | POA: Diagnosis not present

## 2017-08-19 DIAGNOSIS — I10 Essential (primary) hypertension: Secondary | ICD-10-CM | POA: Diagnosis not present

## 2017-08-24 NOTE — Procedures (Signed)
[facility] Latrell Potempa A. Gerilyn Pilgrimoonquah, MD     [email or web address]             NOCTURNAL POLYSOMNOGRAPHY   LOCATION: [facility]   Patient Name: Joy Hawkins, Joy Hawkins Study Date: 08/19/2017 Gender: Female D.O.B: [birth year] Age (years): [age] Referring Provider: Beryle BeamsKofi Liridona Mashaw MD, ABSM Height (inches): 63 Interpreting Physician: Beryle BeamsKofi Kaneisha Ellenberger MD, ABSM Weight (lbs): 216 RPSGT: Peak, Robert BMI: 38 MRN: [id number] Neck Size: 16.00 CLINICAL INFORMATION Sleep Study Type: NPSG  Indication for sleep study: N/A  Epworth Sleepiness Score:  SLEEP STUDY TECHNIQUE As per the AASM Manual for the Scoring of Sleep and Associated Events v2.3 (April 2016) with a hypopnea requiring 4% desaturations.  The channels recorded and monitored were frontal, central and occipital EEG, electrooculogram (EOG), submentalis EMG (chin), nasal and oral airflow, thoracic and abdominal wall motion, anterior tibialis EMG, snore microphone, electrocardiogram, and pulse oximetry.  MEDICATIONS Medications self-administered by patient taken the night of the study : N/A  Current Outpatient Prescriptions:  .  albuterol (PROVENTIL HFA;VENTOLIN HFA) 108 (90 BASE) MCG/ACT inhaler, Inhale 2 puffs into the lungs every 6 (six) hours as needed for wheezing., Disp: , Rfl:  .  amiloride-hydrochlorothiazide (MODURETIC) 5-50 MG tablet, TAKE 1/2 OF A TABLET BY MOUTH DAILY, Disp: 15 tablet, Rfl: 0 .  apixaban (ELIQUIS) 5 MG TABS tablet, Take 1 tablet (5 mg total) by mouth 2 (two) times daily., Disp: 60 tablet, Rfl: 3 .  aspirin-acetaminophen-caffeine (EXCEDRIN MIGRAINE) 250-250-65 MG tablet, Take 1 tablet by mouth every 6 (six) hours as needed for headache., Disp: , Rfl:  .  b complex vitamins tablet, Take 1 tablet by mouth daily., Disp: , Rfl:  .  Calcium Citrate (CITRACAL PO), Take 1 tablet by mouth 2 (two) times daily. , Disp: , Rfl:  .  Chlorpheniramine Maleate (ALLERGY RELIEF PO), Take by mouth daily., Disp: , Rfl:  .   docusate sodium (COLACE) 100 MG capsule, Take 100 mg by mouth daily., Disp: , Rfl:  .  eletriptan (RELPAX) 40 MG tablet, Take 40 mg by mouth 2 (two) times daily as needed for migraine or headache. One tablet by mouth at onset of headache. May repeat in 2 hours if headache persists or recurs., Disp: , Rfl:  .  ergocalciferol (VITAMIN D2) 50000 UNITS capsule, Take 50,000 Units by mouth once a week., Disp: , Rfl:  .  levothyroxine (SYNTHROID, LEVOTHROID) 75 MCG tablet, Take 75 mcg by mouth daily before breakfast., Disp: , Rfl:  .  lubiprostone (AMITIZA) 24 MCG capsule, Take 24 mcg by mouth 2 (two) times daily with a meal., Disp: , Rfl:  .  meloxicam (MOBIC) 7.5 MG tablet, Take 7.5 mg by mouth as needed for pain., Disp: , Rfl:  .  montelukast (SINGULAIR) 10 MG tablet, Take 10 mg by mouth daily. , Disp: , Rfl:  .  morphine (MSIR) 30 MG tablet, Take 30 mg by mouth 2 (two) times daily. , Disp: , Rfl:  .  Multiple Vitamin (MULTIVITAMIN) tablet, Take 1 tablet by mouth daily., Disp: , Rfl:  .  Omeprazole Magnesium (PRILOSEC OTC PO), Take by mouth daily., Disp: , Rfl:  .  oxybutynin (DITROPAN-XL) 5 MG 24 hr tablet, Take 5 mg by mouth daily., Disp: , Rfl:  .  propranolol (INDERAL) 10 MG tablet, Take 10 mg by mouth 2 (two) times daily. , Disp: , Rfl:  .  traMADol (ULTRAM) 50 MG tablet, Take 50-100 mg by mouth as needed. , Disp: , Rfl:  .  verapamil (COVERA HS) 240  MG (CO) 24 hr tablet, Take 120mg  (half tablet) in the morning and 240mg  (whole tablet) in the evening. (Patient taking differently: Take 240 mg by mouth daily. ), Disp: 45 tablet, Rfl: 1 .  vitamin C (ASCORBIC ACID) 500 MG tablet, Take 500 mg by mouth every other day. , Disp: , Rfl:    SLEEP ARCHITECTURE The study was initiated at 10:25:07 PM and ended at 4:39:43 AM.  Sleep onset time was 51.7 minutes and the sleep efficiency was 8.5%. The total sleep time was 32.0 minutes.  Stage REM latency was N/A minutes.  The patient spent 82.81% of the  night in stage N1 sleep, 17.19% in stage N2 sleep, 0.00% in stage N3 and 0.00% in REM.  Alpha intrusion was absent.  Supine sleep was 21.88%.  RESPIRATORY PARAMETERS The overall apnea/hypopnea index (AHI) was 7.5 per hour. There were 0 total apneas, including 0 obstructive, 0 central and 0 mixed apneas. There were 4 hypopneas and 17 RERAs.  The AHI during Stage REM sleep was N/A per hour.  AHI while supine was 8.6 per hour.  The mean oxygen saturation was 91.68%. The minimum SpO2 during sleep was 83.00%.  Moderate snoring was noted during this study.  CARDIAC DATA The 2 lead EKG demonstrated sinus rhythm. The mean heart rate was 62.61 beats per minute. Other EKG findings include: None. LEG MOVEMENT DATA The total PLMS were 0 with a resulting PLMS index of 0.00. Associated arousal with leg movement index was 0.0.  IMPRESSIONS - Abnormal sleep architecture is observed with the profoundly reduced sleep efficiency, absent slow-wave sleep and absent REM sleep. - Mild obstructive sleep apnea not requiring positive pressure treatment is documented during the study. However, because of the very poor sleep efficiency, it is likely that the severity is underestimated.  Argie Ramming, MD Diplomate, American Board of Sleep Medicine.   ELECTRONICALLY SIGNED ON:  08/24/2017, 12:18 PM [facility] PH: [phone]   FX: [phone] ACCREDITED BY THE AMERICAN ACADEMY OF SLEEP MEDICINE

## 2017-10-12 DIAGNOSIS — Z23 Encounter for immunization: Secondary | ICD-10-CM | POA: Diagnosis not present

## 2017-10-20 DIAGNOSIS — G43711 Chronic migraine without aura, intractable, with status migrainosus: Secondary | ICD-10-CM | POA: Diagnosis not present

## 2017-10-20 DIAGNOSIS — G894 Chronic pain syndrome: Secondary | ICD-10-CM | POA: Diagnosis not present

## 2017-10-20 DIAGNOSIS — G4733 Obstructive sleep apnea (adult) (pediatric): Secondary | ICD-10-CM | POA: Diagnosis not present

## 2017-10-20 DIAGNOSIS — Z79899 Other long term (current) drug therapy: Secondary | ICD-10-CM | POA: Diagnosis not present

## 2018-04-08 DIAGNOSIS — Z6835 Body mass index (BMI) 35.0-35.9, adult: Secondary | ICD-10-CM | POA: Diagnosis not present

## 2018-04-08 DIAGNOSIS — Z0000 Encounter for general adult medical examination without abnormal findings: Secondary | ICD-10-CM | POA: Diagnosis not present

## 2018-04-08 DIAGNOSIS — Z1339 Encounter for screening examination for other mental health and behavioral disorders: Secondary | ICD-10-CM | POA: Diagnosis not present

## 2018-04-08 DIAGNOSIS — E039 Hypothyroidism, unspecified: Secondary | ICD-10-CM | POA: Diagnosis not present

## 2018-04-08 DIAGNOSIS — Z7189 Other specified counseling: Secondary | ICD-10-CM | POA: Diagnosis not present

## 2018-04-08 DIAGNOSIS — Z1211 Encounter for screening for malignant neoplasm of colon: Secondary | ICD-10-CM | POA: Diagnosis not present

## 2018-04-08 DIAGNOSIS — G4733 Obstructive sleep apnea (adult) (pediatric): Secondary | ICD-10-CM | POA: Diagnosis not present

## 2018-04-08 DIAGNOSIS — Z79899 Other long term (current) drug therapy: Secondary | ICD-10-CM | POA: Diagnosis not present

## 2018-04-08 DIAGNOSIS — R5383 Other fatigue: Secondary | ICD-10-CM | POA: Diagnosis not present

## 2018-04-08 DIAGNOSIS — I4891 Unspecified atrial fibrillation: Secondary | ICD-10-CM | POA: Diagnosis not present

## 2018-04-08 DIAGNOSIS — Z299 Encounter for prophylactic measures, unspecified: Secondary | ICD-10-CM | POA: Diagnosis not present

## 2018-04-08 DIAGNOSIS — E559 Vitamin D deficiency, unspecified: Secondary | ICD-10-CM | POA: Diagnosis not present

## 2018-04-08 DIAGNOSIS — Z1331 Encounter for screening for depression: Secondary | ICD-10-CM | POA: Diagnosis not present

## 2018-04-08 DIAGNOSIS — I10 Essential (primary) hypertension: Secondary | ICD-10-CM | POA: Diagnosis not present

## 2018-04-21 DIAGNOSIS — Z789 Other specified health status: Secondary | ICD-10-CM | POA: Diagnosis not present

## 2018-04-21 DIAGNOSIS — G4733 Obstructive sleep apnea (adult) (pediatric): Secondary | ICD-10-CM | POA: Diagnosis not present

## 2018-04-21 DIAGNOSIS — G250 Essential tremor: Secondary | ICD-10-CM | POA: Diagnosis not present

## 2018-04-21 DIAGNOSIS — I10 Essential (primary) hypertension: Secondary | ICD-10-CM | POA: Diagnosis not present

## 2018-04-21 DIAGNOSIS — I4891 Unspecified atrial fibrillation: Secondary | ICD-10-CM | POA: Diagnosis not present

## 2018-04-21 DIAGNOSIS — Z6835 Body mass index (BMI) 35.0-35.9, adult: Secondary | ICD-10-CM | POA: Diagnosis not present

## 2018-04-21 DIAGNOSIS — Z299 Encounter for prophylactic measures, unspecified: Secondary | ICD-10-CM | POA: Diagnosis not present

## 2018-04-21 DIAGNOSIS — R7301 Impaired fasting glucose: Secondary | ICD-10-CM | POA: Diagnosis not present

## 2018-04-21 DIAGNOSIS — R2689 Other abnormalities of gait and mobility: Secondary | ICD-10-CM | POA: Diagnosis not present

## 2018-04-21 DIAGNOSIS — J069 Acute upper respiratory infection, unspecified: Secondary | ICD-10-CM | POA: Diagnosis not present

## 2018-04-21 DIAGNOSIS — E039 Hypothyroidism, unspecified: Secondary | ICD-10-CM | POA: Diagnosis not present

## 2018-04-21 DIAGNOSIS — Z79899 Other long term (current) drug therapy: Secondary | ICD-10-CM | POA: Diagnosis not present

## 2018-11-25 DIAGNOSIS — Z299 Encounter for prophylactic measures, unspecified: Secondary | ICD-10-CM | POA: Diagnosis not present

## 2018-11-25 DIAGNOSIS — I10 Essential (primary) hypertension: Secondary | ICD-10-CM | POA: Diagnosis not present

## 2018-11-25 DIAGNOSIS — Z6836 Body mass index (BMI) 36.0-36.9, adult: Secondary | ICD-10-CM | POA: Diagnosis not present

## 2018-11-25 DIAGNOSIS — F329 Major depressive disorder, single episode, unspecified: Secondary | ICD-10-CM | POA: Diagnosis not present

## 2018-11-25 DIAGNOSIS — Z23 Encounter for immunization: Secondary | ICD-10-CM | POA: Diagnosis not present

## 2018-11-25 DIAGNOSIS — I4891 Unspecified atrial fibrillation: Secondary | ICD-10-CM | POA: Diagnosis not present

## 2018-11-25 DIAGNOSIS — N3281 Overactive bladder: Secondary | ICD-10-CM | POA: Diagnosis not present

## 2018-11-29 DIAGNOSIS — G250 Essential tremor: Secondary | ICD-10-CM | POA: Diagnosis not present

## 2018-11-29 DIAGNOSIS — R2689 Other abnormalities of gait and mobility: Secondary | ICD-10-CM | POA: Diagnosis not present

## 2018-11-29 DIAGNOSIS — G4733 Obstructive sleep apnea (adult) (pediatric): Secondary | ICD-10-CM | POA: Diagnosis not present

## 2018-11-29 DIAGNOSIS — M5002 Cervical disc disorder with myelopathy, mid-cervical region, unspecified level: Secondary | ICD-10-CM | POA: Diagnosis not present

## 2019-04-29 DIAGNOSIS — Z713 Dietary counseling and surveillance: Secondary | ICD-10-CM | POA: Diagnosis not present

## 2019-04-29 DIAGNOSIS — Z6836 Body mass index (BMI) 36.0-36.9, adult: Secondary | ICD-10-CM | POA: Diagnosis not present

## 2019-04-29 DIAGNOSIS — R509 Fever, unspecified: Secondary | ICD-10-CM | POA: Diagnosis not present

## 2019-04-29 DIAGNOSIS — I10 Essential (primary) hypertension: Secondary | ICD-10-CM | POA: Diagnosis not present

## 2019-05-03 DIAGNOSIS — R05 Cough: Secondary | ICD-10-CM | POA: Diagnosis not present

## 2019-05-03 DIAGNOSIS — B349 Viral infection, unspecified: Secondary | ICD-10-CM | POA: Diagnosis not present

## 2019-05-03 DIAGNOSIS — R509 Fever, unspecified: Secondary | ICD-10-CM | POA: Diagnosis not present

## 2019-05-03 DIAGNOSIS — J069 Acute upper respiratory infection, unspecified: Secondary | ICD-10-CM | POA: Diagnosis not present

## 2019-05-25 DIAGNOSIS — R197 Diarrhea, unspecified: Secondary | ICD-10-CM | POA: Diagnosis not present

## 2019-05-25 DIAGNOSIS — Z299 Encounter for prophylactic measures, unspecified: Secondary | ICD-10-CM | POA: Diagnosis not present

## 2019-05-25 DIAGNOSIS — Z789 Other specified health status: Secondary | ICD-10-CM | POA: Diagnosis not present

## 2019-05-25 DIAGNOSIS — Z6836 Body mass index (BMI) 36.0-36.9, adult: Secondary | ICD-10-CM | POA: Diagnosis not present

## 2019-05-25 DIAGNOSIS — G43909 Migraine, unspecified, not intractable, without status migrainosus: Secondary | ICD-10-CM | POA: Diagnosis not present

## 2019-05-25 DIAGNOSIS — I10 Essential (primary) hypertension: Secondary | ICD-10-CM | POA: Diagnosis not present

## 2019-09-14 DIAGNOSIS — I10 Essential (primary) hypertension: Secondary | ICD-10-CM | POA: Diagnosis not present

## 2019-09-14 DIAGNOSIS — E7800 Pure hypercholesterolemia, unspecified: Secondary | ICD-10-CM | POA: Diagnosis not present

## 2019-09-14 DIAGNOSIS — R5383 Other fatigue: Secondary | ICD-10-CM | POA: Diagnosis not present

## 2019-09-14 DIAGNOSIS — E039 Hypothyroidism, unspecified: Secondary | ICD-10-CM | POA: Diagnosis not present

## 2019-09-14 DIAGNOSIS — Z7189 Other specified counseling: Secondary | ICD-10-CM | POA: Diagnosis not present

## 2019-09-14 DIAGNOSIS — Z1339 Encounter for screening examination for other mental health and behavioral disorders: Secondary | ICD-10-CM | POA: Diagnosis not present

## 2019-09-14 DIAGNOSIS — Z1331 Encounter for screening for depression: Secondary | ICD-10-CM | POA: Diagnosis not present

## 2019-09-14 DIAGNOSIS — Z299 Encounter for prophylactic measures, unspecified: Secondary | ICD-10-CM | POA: Diagnosis not present

## 2019-09-14 DIAGNOSIS — Z0000 Encounter for general adult medical examination without abnormal findings: Secondary | ICD-10-CM | POA: Diagnosis not present

## 2019-09-14 DIAGNOSIS — Z6834 Body mass index (BMI) 34.0-34.9, adult: Secondary | ICD-10-CM | POA: Diagnosis not present

## 2019-10-12 DIAGNOSIS — Z299 Encounter for prophylactic measures, unspecified: Secondary | ICD-10-CM | POA: Diagnosis not present

## 2019-10-12 DIAGNOSIS — I10 Essential (primary) hypertension: Secondary | ICD-10-CM | POA: Diagnosis not present

## 2019-10-12 DIAGNOSIS — E039 Hypothyroidism, unspecified: Secondary | ICD-10-CM | POA: Diagnosis not present

## 2019-10-12 DIAGNOSIS — G4733 Obstructive sleep apnea (adult) (pediatric): Secondary | ICD-10-CM | POA: Diagnosis not present

## 2019-10-12 DIAGNOSIS — Z6835 Body mass index (BMI) 35.0-35.9, adult: Secondary | ICD-10-CM | POA: Diagnosis not present

## 2020-11-22 DIAGNOSIS — E7800 Pure hypercholesterolemia, unspecified: Secondary | ICD-10-CM | POA: Diagnosis not present

## 2020-11-22 DIAGNOSIS — Z299 Encounter for prophylactic measures, unspecified: Secondary | ICD-10-CM | POA: Diagnosis not present

## 2020-11-22 DIAGNOSIS — R5383 Other fatigue: Secondary | ICD-10-CM | POA: Diagnosis not present

## 2020-11-22 DIAGNOSIS — Z0000 Encounter for general adult medical examination without abnormal findings: Secondary | ICD-10-CM | POA: Diagnosis not present

## 2020-11-22 DIAGNOSIS — I10 Essential (primary) hypertension: Secondary | ICD-10-CM | POA: Diagnosis not present

## 2020-11-22 DIAGNOSIS — Z1331 Encounter for screening for depression: Secondary | ICD-10-CM | POA: Diagnosis not present

## 2020-11-22 DIAGNOSIS — Z7189 Other specified counseling: Secondary | ICD-10-CM | POA: Diagnosis not present

## 2020-11-22 DIAGNOSIS — Z6835 Body mass index (BMI) 35.0-35.9, adult: Secondary | ICD-10-CM | POA: Diagnosis not present

## 2020-11-22 DIAGNOSIS — Z1339 Encounter for screening examination for other mental health and behavioral disorders: Secondary | ICD-10-CM | POA: Diagnosis not present

## 2020-11-22 DIAGNOSIS — Z79899 Other long term (current) drug therapy: Secondary | ICD-10-CM | POA: Diagnosis not present

## 2020-11-22 DIAGNOSIS — Z1211 Encounter for screening for malignant neoplasm of colon: Secondary | ICD-10-CM | POA: Diagnosis not present

## 2020-11-23 ENCOUNTER — Other Ambulatory Visit: Payer: Self-pay | Admitting: Internal Medicine

## 2020-11-23 DIAGNOSIS — Z1231 Encounter for screening mammogram for malignant neoplasm of breast: Secondary | ICD-10-CM

## 2021-03-19 DIAGNOSIS — M818 Other osteoporosis without current pathological fracture: Secondary | ICD-10-CM | POA: Diagnosis not present

## 2021-03-19 DIAGNOSIS — E2839 Other primary ovarian failure: Secondary | ICD-10-CM | POA: Diagnosis not present

## 2021-12-31 DIAGNOSIS — Z111 Encounter for screening for respiratory tuberculosis: Secondary | ICD-10-CM | POA: Diagnosis not present

## 2021-12-31 DIAGNOSIS — Z23 Encounter for immunization: Secondary | ICD-10-CM | POA: Diagnosis not present

## 2021-12-31 DIAGNOSIS — G43909 Migraine, unspecified, not intractable, without status migrainosus: Secondary | ICD-10-CM | POA: Diagnosis not present

## 2021-12-31 DIAGNOSIS — J45909 Unspecified asthma, uncomplicated: Secondary | ICD-10-CM | POA: Diagnosis not present

## 2021-12-31 DIAGNOSIS — Z299 Encounter for prophylactic measures, unspecified: Secondary | ICD-10-CM | POA: Diagnosis not present

## 2021-12-31 DIAGNOSIS — I10 Essential (primary) hypertension: Secondary | ICD-10-CM | POA: Diagnosis not present

## 2021-12-31 DIAGNOSIS — F32A Depression, unspecified: Secondary | ICD-10-CM | POA: Diagnosis not present

## 2022-01-20 DIAGNOSIS — Z111 Encounter for screening for respiratory tuberculosis: Secondary | ICD-10-CM | POA: Diagnosis not present

## 2022-01-31 DIAGNOSIS — Z789 Other specified health status: Secondary | ICD-10-CM | POA: Diagnosis not present

## 2022-01-31 DIAGNOSIS — Z299 Encounter for prophylactic measures, unspecified: Secondary | ICD-10-CM | POA: Diagnosis not present

## 2022-01-31 DIAGNOSIS — Z1339 Encounter for screening examination for other mental health and behavioral disorders: Secondary | ICD-10-CM | POA: Diagnosis not present

## 2022-01-31 DIAGNOSIS — I10 Essential (primary) hypertension: Secondary | ICD-10-CM | POA: Diagnosis not present

## 2022-01-31 DIAGNOSIS — I4891 Unspecified atrial fibrillation: Secondary | ICD-10-CM | POA: Diagnosis not present

## 2022-01-31 DIAGNOSIS — Z0000 Encounter for general adult medical examination without abnormal findings: Secondary | ICD-10-CM | POA: Diagnosis not present

## 2022-01-31 DIAGNOSIS — Z1331 Encounter for screening for depression: Secondary | ICD-10-CM | POA: Diagnosis not present

## 2022-01-31 DIAGNOSIS — Z7189 Other specified counseling: Secondary | ICD-10-CM | POA: Diagnosis not present

## 2022-01-31 DIAGNOSIS — Z23 Encounter for immunization: Secondary | ICD-10-CM | POA: Diagnosis not present

## 2022-01-31 DIAGNOSIS — R5383 Other fatigue: Secondary | ICD-10-CM | POA: Diagnosis not present

## 2022-01-31 DIAGNOSIS — E7800 Pure hypercholesterolemia, unspecified: Secondary | ICD-10-CM | POA: Diagnosis not present

## 2022-01-31 DIAGNOSIS — Z6832 Body mass index (BMI) 32.0-32.9, adult: Secondary | ICD-10-CM | POA: Diagnosis not present

## 2022-02-14 ENCOUNTER — Ambulatory Visit: Payer: Medicare Other | Admitting: Internal Medicine

## 2022-02-20 ENCOUNTER — Ambulatory Visit (INDEPENDENT_AMBULATORY_CARE_PROVIDER_SITE_OTHER): Payer: Medicare Other | Admitting: Cardiology

## 2022-02-20 ENCOUNTER — Encounter: Payer: Self-pay | Admitting: Cardiology

## 2022-02-20 VITALS — BP 118/68 | HR 80 | Ht 64.5 in | Wt 185.8 lb

## 2022-02-20 DIAGNOSIS — I480 Paroxysmal atrial fibrillation: Secondary | ICD-10-CM | POA: Diagnosis not present

## 2022-02-20 DIAGNOSIS — I10 Essential (primary) hypertension: Secondary | ICD-10-CM | POA: Diagnosis not present

## 2022-02-20 MED ORDER — VERAPAMIL HCL ER 240 MG PO TBCR: 120.0000 mg | EXTENDED_RELEASE_TABLET | Freq: Every morning | ORAL | 2 refills | Status: DC

## 2022-02-20 NOTE — Patient Instructions (Addendum)
Medication Instructions:  ?Your physician has recommended you make the following change in your medication:  ?Stop amilaride ?Increase verapamil to 120 mg in the morning and 240 mg in the evening ?Continue other medications the same ? ?Labwork: ?none ? ?Testing/Procedures: ?none ? ?Follow-Up: ?Your physician recommends that you schedule a follow-up appointment in: 4 months ?Your physician recommends that you schedule a follow-up appointment in: 1 week for a nurse visit in [place]. Update [place] on your symptoms at that time ? ?Any Other Special Instructions Will Be Listed Below (If Applicable). ? ?If you need a refill on your cardiac medications before your next appointment, please call your pharmacy. ?

## 2022-02-20 NOTE — Progress Notes (Signed)
? ? ? ?Clinical Summary ?Ms. Ebbing is a 76 y.o.female seen today as a new patient, last see by Dr Purvis Sheffield in 2016 ? ?1.Afib ?- from notes did not tolerate metoprolol in the past or xarelto ?- afib comes and goes, no set pattern. Few times a week occurs. Feeling of heart racing, strong heavy  heart beat, weakness, occasional dizziness. Lasts several hours. ?- compliant with meds ?- taking verapamil 240mg  once daily. Her neurologist had decreased ?- propanolol for tremors, dose lowered due to low bp's. Reports SBP low 100s  ? ?2. HTN ?- compliant with meds ? ? ?3. Migraine ?- has been on verpamil for prevension ? ? ? ?Former EMT ? ?Past Medical History:  ?Diagnosis Date  ? Arrhythmia   ? Arthritis   ? Asthma   ? Atrial fibrillation (HCC)   ? Chronic kidney disease   ? Depression   ? Essential tremor   ? Fibromyalgia   ? Headache   ? Hypertension   ? Osteopenia   ? Thyroid disease   ? ? ? ?Allergies  ?Allergen Reactions  ? Biaxin [Clarithromycin]   ? Ciprofloxacin   ? Cymbalta [Duloxetine Hcl]   ? Levaquin [Levofloxacin In D5w]   ?  Throat swelling/rash 07/05/15  ? Naproxen   ? Penicillins   ? Prozac [Fluoxetine Hcl] Other (See Comments)  ?  Weakness, irregular hearbeat  ? Tequin [Gatifloxacin]   ? Vicodin [Hydrocodone-Acetaminophen]   ? ? ? ?Current Outpatient Medications  ?Medication Sig Dispense Refill  ? albuterol (PROVENTIL HFA;VENTOLIN HFA) 108 (90 BASE) MCG/ACT inhaler Inhale 2 puffs into the lungs every 6 (six) hours as needed for wheezing.    ? aMILoride (MIDAMOR) 5 MG tablet Take 5 mg by mouth daily.    ? aspirin-acetaminophen-caffeine (EXCEDRIN MIGRAINE) 250-250-65 MG tablet Take 1 tablet by mouth every 6 (six) hours as needed for headache.    ? b complex vitamins tablet Take 1 tablet by mouth daily.    ? Calcium Citrate (CITRACAL PO) Take 1 tablet by mouth 2 (two) times daily.     ? Chlorpheniramine Maleate (ALLERGY RELIEF PO) Take 1 tablet by mouth daily as needed.    ? Cholecalciferol 125 MCG (5000  UT) TABS Take 1 tablet by mouth daily.    ? citalopram (CELEXA) 40 MG tablet Take 40 mg by mouth daily.    ? levothyroxine (SYNTHROID, LEVOTHROID) 75 MCG tablet Take 75 mcg by mouth daily before breakfast.    ? montelukast (SINGULAIR) 10 MG tablet Take 10 mg by mouth daily.     ? Multiple Vitamin (MULTIVITAMIN) tablet Take 1 tablet by mouth daily.    ? Omeprazole Magnesium (PRILOSEC OTC PO) Take 1 capsule by mouth 2 (two) times daily.    ? propranolol (INDERAL) 10 MG tablet Take 10 mg by mouth daily.    ? rizatriptan (MAXALT) 10 MG tablet Take 10 mg by mouth 3 (three) times daily as needed.    ? verapamil (VERELAN PM) 240 MG 24 hr capsule Take 240 mg by mouth at bedtime.    ? vitamin C (ASCORBIC ACID) 500 MG tablet Take 500 mg by mouth every other day.     ? verapamil (COVERA HS) 240 MG (CO) 24 hr tablet Take 120mg  (half tablet) in the morning and 240mg  (whole tablet) in the evening. (Patient taking differently: Take 240 mg by mouth daily. ) 45 tablet 1  ? ?No current facility-administered medications for this visit.  ? ? ? ?Past Surgical History:  ?  Procedure Laterality Date  ? CHOLECYSTECTOMY    ? GYNECOLOGIC CRYOSURGERY    ? kidney stone removed    ? ? ? ?Allergies  ?Allergen Reactions  ? Biaxin [Clarithromycin]   ? Ciprofloxacin   ? Cymbalta [Duloxetine Hcl]   ? Levaquin [Levofloxacin In D5w]   ?  Throat swelling/rash 07/05/15  ? Naproxen   ? Penicillins   ? Prozac [Fluoxetine Hcl] Other (See Comments)  ?  Weakness, irregular hearbeat  ? Tequin [Gatifloxacin]   ? Vicodin [Hydrocodone-Acetaminophen]   ? ? ? ? ?Family History  ?Problem Relation Age of Onset  ? Osteoporosis Other   ?     family history  ? Hypertension Mother   ? Stroke Mother   ? Cancer - Other Mother   ?     basal cell  ? Dementia Mother   ? Hypertension Father   ? Stroke Father   ? Hyperlipidemia Father   ? Depression Father   ? Dementia Father   ? Dementia Sister   ? Depression Brother   ? Depression Brother   ? Depression Brother   ? Depression  Other   ? ? ? ?Social History ?Ms. Rampey reports that she has never smoked. She has never used smokeless tobacco. ?Ms. Ton reports current alcohol use. ? ? ?Review of Systems ?CONSTITUTIONAL: No weight loss, fever, chills, weakness or fatigue.  ?HEENT: Eyes: No visual loss, blurred vision, double vision or yellow sclerae.No hearing loss, sneezing, congestion, runny nose or sore throat.  ?SKIN: No rash or itching.  ?CARDIOVASCULAR: per hpi ?RESPIRATORY: No shortness of breath, cough or sputum.  ?GASTROINTESTINAL: No anorexia, nausea, vomiting or diarrhea. No abdominal pain or blood.  ?GENITOURINARY: No burning on urination, no polyuria ?NEUROLOGICAL: No headache, dizziness, syncope, paralysis, ataxia, numbness or tingling in the extremities. No change in bowel or bladder control.  ?MUSCULOSKELETAL: No muscle, back pain, joint pain or stiffness.  ?LYMPHATICS: No enlarged nodes. No history of splenectomy.  ?PSYCHIATRIC: No history of depression or anxiety.  ?ENDOCRINOLOGIC: No reports of sweating, cold or heat intolerance. No polyuria or polydipsia.  ?. ? ? ?Physical Examination ?Vitals:  ? 02/20/22 1253  ?BP: 118/68  ?Pulse: 80  ?SpO2: 98%  ? ?Filed Weights  ? 02/20/22 1253  ?Weight: 185 lb 12.8 oz (84.3 kg)  ? ? ?Gen: resting comfortably, no acute distress ?HEENT: no scleral icterus, pupils equal round and reactive, no palptable cervical adenopathy,  ?CV: RRR,no m/r/g no jvd ?Resp: Clear to auscultation bilaterally ?GI: abdomen is soft, non-tender, non-distended, normal bowel sounds, no hepatosplenomegaly ?MSK: extremities are warm, no edema.  ?Skin: warm, no rash ?Neuro:  no focal deficits ?Psych: appropriate affect ? ? ?Diagnostic Studies ? ?09/2013 echo ?Study Conclusions  ? ?- Study data: Technically difficult study.  ?- Left ventricle: The cavity size was normal. Wall thickness  ?  was normal. Systolic function was normal. The estimated  ?  ejection fraction was in the range of 60% to 65%. Left  ?   ventricular diastolic function parameters were normal.  ?- Aortic valve: Valve area: 2.51cm^2(VTI). Valve area:  ?  2.61cm^2 (Vmax).  ?- Mitral valve: Calcified annulus. Mildly thickened leaflets  ? ? ?Assessment and Plan  ?1.PAF ?- long history ?- recent increase in symptoms ?- reports verapamil and propranolol prevously lowered due to low bp's ?- will try increasing verapamil back to prior dose of 120mg  in am and 240mg  in pm ?-did not tolerate lopressor in the past Im not clear on details, has  been on propranolol for tremor. Could titrate as well in the future. If unable to control symptoms with av nodal agent titration would plan monitor to better quantify afib burden and then consider antiarrhythmic.  ?- update [place] on symptoms 1 week ?- EKG today shows NSR ? ?2. HTN ?- do not see indication for continued use of amilaride, will d/c. May allow more room with bp's to titrate her av nodal agents.  ?-nursing visit for bp check 1 week.  ? ? ? ? ? ?Antoine Poche, M.D. ?

## 2022-02-21 ENCOUNTER — Other Ambulatory Visit: Payer: Self-pay | Admitting: Internal Medicine

## 2022-02-21 DIAGNOSIS — Z139 Encounter for screening, unspecified: Secondary | ICD-10-CM

## 2022-02-27 ENCOUNTER — Other Ambulatory Visit: Payer: Self-pay

## 2022-02-27 ENCOUNTER — Ambulatory Visit (INDEPENDENT_AMBULATORY_CARE_PROVIDER_SITE_OTHER): Payer: Medicare Other | Admitting: *Deleted

## 2022-02-27 VITALS — BP 118/60 | HR 69 | Ht 64.5 in | Wt 187.0 lb

## 2022-02-27 DIAGNOSIS — Z0130 Encounter for examination of blood pressure without abnormal findings: Secondary | ICD-10-CM | POA: Diagnosis not present

## 2022-02-27 NOTE — Progress Notes (Addendum)
Pt states that she has not been able to tell a difference with new medication change. She states that she is still having episodes of AFib and SOB during these episodes and with exertion. Pt reports no increase in dizziness. She states that the episodes of Afib come every other day, and if she rest the day of the episode they become better. Please advise.  ?

## 2022-03-05 ENCOUNTER — Inpatient Hospital Stay: Admission: RE | Admit: 2022-03-05 | Payer: Medicare Other | Source: Ambulatory Visit

## 2022-04-30 ENCOUNTER — Ambulatory Visit
Admission: RE | Admit: 2022-04-30 | Discharge: 2022-04-30 | Disposition: A | Discharge status: Home / Self Care | Payer: Medicare Other | Source: Ambulatory Visit | Attending: Internal Medicine | Admitting: Internal Medicine

## 2022-04-30 DIAGNOSIS — Z139 Encounter for screening, unspecified: Secondary | ICD-10-CM

## 2022-04-30 DIAGNOSIS — Z1231 Encounter for screening mammogram for malignant neoplasm of breast: Secondary | ICD-10-CM | POA: Diagnosis not present

## 2022-06-18 DIAGNOSIS — Z79899 Other long term (current) drug therapy: Secondary | ICD-10-CM | POA: Diagnosis not present

## 2022-06-18 DIAGNOSIS — E7800 Pure hypercholesterolemia, unspecified: Secondary | ICD-10-CM | POA: Diagnosis not present

## 2022-06-18 DIAGNOSIS — R5383 Other fatigue: Secondary | ICD-10-CM | POA: Diagnosis not present

## 2022-07-03 ENCOUNTER — Encounter: Payer: Self-pay | Admitting: Cardiology

## 2022-07-03 ENCOUNTER — Ambulatory Visit (INDEPENDENT_AMBULATORY_CARE_PROVIDER_SITE_OTHER): Payer: Medicare Other | Admitting: Cardiology

## 2022-07-03 VITALS — BP 142/80 | HR 72 | Ht 65.5 in | Wt 191.2 lb

## 2022-07-03 DIAGNOSIS — I480 Paroxysmal atrial fibrillation: Secondary | ICD-10-CM | POA: Diagnosis not present

## 2022-07-03 MED ORDER — PROPRANOLOL HCL 10 MG PO TABS: 10.0000 mg | ORAL_TABLET | Freq: Two times a day (BID) | ORAL | 6 refills | Status: DC

## 2022-07-03 NOTE — Progress Notes (Signed)
Clinical Summary Joy Hawkins is a 76 y.o.female seen today for follow up of the following medical problems.    1.Afib - from notes did not tolerate metoprolol in the past or xarelto - afib comes and goes, no set pattern. Few times a week occurs. Feeling of heart racing, strong heavy  heart beat, weakness, occasional dizziness. Lasts several hours. - compliant with meds - taking verapamil 240mg  once daily. Her neurologist had decreased - propanolol for tremors, dose lowered due to low bp's. Reports SBP low 100s    - symptoms much improved, better with increased verapamil.  - anticoag history unclear. Had been on eliquis in 2016 when had seen Dr 2017, was not on at our last visit. Interested in retrying but recently had Hgb 8.3 on labs and pcp working up anemia.   2. HTN - compliant with meds     3. Migraine - has been on verpamil for prevension   4. Anemia - Hgb is 8.3 05/2022, new diagnosis - repeat cbc coming up.    Former EMT   Past Medical History:  Diagnosis Date   Arrhythmia    Arthritis    Asthma    Atrial fibrillation (HCC)    Chronic kidney disease    Depression    Essential tremor    Fibromyalgia    Headache    Hypertension    Osteopenia    Thyroid disease      Allergies  Allergen Reactions   Molds & Smuts Cough and Shortness Of Breath   Biaxin [Clarithromycin]    Ciprofloxacin    Cymbalta [Duloxetine Hcl]    Levaquin [Levofloxacin In D5w]     Throat swelling/rash 07/05/15   Naproxen    Penicillins    Prozac [Fluoxetine Hcl] Other (See Comments)    Weakness, irregular hearbeat   Tequin [Gatifloxacin]    Vicodin [Hydrocodone-Acetaminophen]      Current Outpatient Medications  Medication Sig Dispense Refill   albuterol (PROVENTIL HFA;VENTOLIN HFA) 108 (90 BASE) MCG/ACT inhaler Inhale 2 puffs into the lungs every 6 (six) hours as needed for wheezing.     aspirin 81 MG EC tablet Take by mouth.     aspirin-acetaminophen-caffeine  (EXCEDRIN MIGRAINE) 250-250-65 MG tablet Take 1 tablet by mouth every 6 (six) hours as needed for headache.     b complex vitamins tablet Take 1 tablet by mouth daily.     Calcium Citrate (CITRACAL PO) Take 1 tablet by mouth 2 (two) times daily.      Chlorpheniramine Maleate (ALLERGY RELIEF PO) Take 1 tablet by mouth daily as needed.     Cholecalciferol 125 MCG (5000 UT) TABS Take 1 tablet by mouth daily.     citalopram (CELEXA) 40 MG tablet Take 40 mg by mouth daily.     levothyroxine (SYNTHROID, LEVOTHROID) 75 MCG tablet Take 75 mcg by mouth daily before breakfast.     montelukast (SINGULAIR) 10 MG tablet Take 10 mg by mouth daily.      Multiple Vitamin (MULTIVITAMIN) tablet Take 1 tablet by mouth daily.     Omeprazole Magnesium (PRILOSEC OTC PO) Take 1 capsule by mouth 2 (two) times daily.     propranolol (INDERAL) 10 MG tablet Take 10 mg by mouth daily.     rizatriptan (MAXALT) 10 MG tablet Take 10 mg by mouth 3 (three) times daily as needed.     verapamil (CALAN-SR) 240 MG CR tablet Take 0.5 tablets (120 mg total) by mouth in the morning. &  240 mg in the evening 135 tablet 2   vitamin C (ASCORBIC ACID) 500 MG tablet Take 500 mg by mouth every other day.      No current facility-administered medications for this visit.     Past Surgical History:  Procedure Laterality Date   CHOLECYSTECTOMY     GYNECOLOGIC CRYOSURGERY     kidney stone removed       Allergies  Allergen Reactions   Molds & Smuts Cough and Shortness Of Breath   Biaxin [Clarithromycin]    Ciprofloxacin    Cymbalta [Duloxetine Hcl]    Levaquin [Levofloxacin In D5w]     Throat swelling/rash 07/05/15   Naproxen    Penicillins    Prozac [Fluoxetine Hcl] Other (See Comments)    Weakness, irregular hearbeat   Tequin [Gatifloxacin]    Vicodin [Hydrocodone-Acetaminophen]       Family History  Problem Relation Age of Onset   Osteoporosis Other        family history   Hypertension Mother    Stroke Mother     Cancer - Other Mother        basal cell   Dementia Mother    Hypertension Father    Stroke Father    Hyperlipidemia Father    Depression Father    Dementia Father    Dementia Sister    Depression Brother    Depression Brother    Depression Brother    Depression Other      Social History Ms. Mounce reports that she has never smoked. She has never used smokeless tobacco. Ms. Saephanh reports current alcohol use.   Review of Systems CONSTITUTIONAL: No weight loss, fever, chills, weakness or fatigue.  HEENT: Eyes: No visual loss, blurred vision, double vision or yellow sclerae.No hearing loss, sneezing, congestion, runny nose or sore throat.  SKIN: No rash or itching.  CARDIOVASCULAR: per hpi RESPIRATORY: No shortness of breath, cough or sputum.  GASTROINTESTINAL: No anorexia, nausea, vomiting or diarrhea. No abdominal pain or blood.  GENITOURINARY: No burning on urination, no polyuria NEUROLOGICAL: No headache, dizziness, syncope, paralysis, ataxia, numbness or tingling in the extremities. No change in bowel or bladder control.  MUSCULOSKELETAL: No muscle, back pain, joint pain or stiffness.  LYMPHATICS: No enlarged nodes. No history of splenectomy.  PSYCHIATRIC: No history of depression or anxiety.  ENDOCRINOLOGIC: No reports of sweating, cold or heat intolerance. No polyuria or polydipsia.  Marland Kitchen   Physical Examination Today's Vitals   07/03/22 1356  BP: (!) 142/80  Pulse: 72  SpO2: 98%  Weight: 191 lb 3.2 oz (86.7 kg)  Height: 5' 5.5" (1.664 m)   Body mass index is 31.33 kg/m.  Gen: resting comfortably, no acute distress HEENT: no scleral icterus, pupils equal round and reactive, no palptable cervical adenopathy,  CV: RRR, no m/r/g no jvd Resp: Clear to auscultation bilaterally GI: abdomen is soft, non-tender, non-distended, normal bowel sounds, no hepatosplenomegaly MSK: extremities are warm, no edema.  Skin: warm, no rash Neuro:  no focal deficits Psych:  appropriate affect   Diagnostic Studies  09/2013 echo Study Conclusions   - Study data: Technically difficult study.  - Left ventricle: The cavity size was normal. Wall thickness    was normal. Systolic function was normal. The estimated    ejection fraction was in the range of 60% to 65%. Left    ventricular diastolic function parameters were normal.  - Aortic valve: Valve area: 2.51cm^2(VTI). Valve area:    2.61cm^2 (Vmax).  - Mitral valve:  Calcified annulus. Mildly thickened leaflets    Assessment and Plan  1.PAF - doing well continue current meds - consider anticoag once anemia workup complete, virtual visit 6 weeks to discuss   2. HTN - home bp's at goal, continue curent meds      Antoine Poche, M.D.

## 2022-07-03 NOTE — Patient Instructions (Addendum)
Medication Instructions:  Increase Propranolol to 10mg  twice a day Continue all other medications.     Labwork: none  Testing/Procedures: none  Follow-Up: 6 weeks  Any Other Special Instructions Will Be Listed Below (If Applicable).   If you need a refill on your cardiac medications before your next appointment, please call your pharmacy.

## 2022-07-04 DIAGNOSIS — R5383 Other fatigue: Secondary | ICD-10-CM | POA: Diagnosis not present

## 2022-09-02 DIAGNOSIS — D649 Anemia, unspecified: Secondary | ICD-10-CM | POA: Diagnosis not present

## 2022-09-02 DIAGNOSIS — Z6833 Body mass index (BMI) 33.0-33.9, adult: Secondary | ICD-10-CM | POA: Diagnosis not present

## 2022-09-02 DIAGNOSIS — Z1211 Encounter for screening for malignant neoplasm of colon: Secondary | ICD-10-CM | POA: Diagnosis not present

## 2022-09-02 DIAGNOSIS — G250 Essential tremor: Secondary | ICD-10-CM | POA: Diagnosis not present

## 2022-09-02 DIAGNOSIS — I10 Essential (primary) hypertension: Secondary | ICD-10-CM | POA: Diagnosis not present

## 2022-09-02 DIAGNOSIS — Z299 Encounter for prophylactic measures, unspecified: Secondary | ICD-10-CM | POA: Diagnosis not present

## 2022-09-22 ENCOUNTER — Encounter: Payer: Self-pay | Admitting: Cardiology

## 2022-09-22 ENCOUNTER — Ambulatory Visit: Payer: Medicare Other | Attending: Cardiology | Admitting: Cardiology

## 2022-09-22 VITALS — BP 128/76 | HR 68 | Ht 65.0 in | Wt 192.0 lb

## 2022-09-22 DIAGNOSIS — I480 Paroxysmal atrial fibrillation: Secondary | ICD-10-CM

## 2022-09-22 NOTE — Progress Notes (Signed)
Virtual Visit via Telephone Note   Because of Era Parr Passon's co-morbid illnesses, she is at least at moderate risk for complications without adequate follow up.  This format is felt to be most appropriate for this patient at this time.  The patient did not have access to video technology/had technical difficulties with video requiring transitioning to audio format only (telephone).  All issues noted in this document were discussed and addressed.  No physical exam could be performed with this format.  Please refer to the patient's chart for her consent to telehealth for [facility] [place].    Date:  09/22/2022   ID:  EZELL MELIKIAN, DOB 03-14-1946, MRN [id number] The patient was identified using 2 identifiers.  Patient Location: Home Provider Location: Office/Clinic   PCP:  Monico Blitz, [facility] Cardiologist:  Carlyle Dolly, MD     Evaluation Performed:  Follow-Up Visit  Chief Complaint:  Follow up visit  History of Present Illness:    LAURIELLE SELMON is a 76 y.o. female seen today for follow up of the following medical problems.   This is a focused visit to reaadress afib and anticoagulation.   1.Afib - from notes did not tolerate metoprolol in the past or xarelto - afib comes and goes, no set pattern. Few times a week occurs. Feeling of heart racing, strong heavy  heart beat, weakness, occasional dizziness. Lasts several hours. - compliant with meds - taking verapamil 240mg  once daily. Her neurologist had decreased - propanolol for tremors, dose lowered due to low bp's. Reports SBP low 100s      - symptoms much improved, better with increased verapamil.  - anticoag history unclear. Had been on eliquis in 2016 when had seen Dr Bronson Ing, was not on at our last visit. Interested in retrying but recently had Hgb 8.3 on labs and pcp working up anemia.   - since last visit repeat Hgb 8.6, low mcv and iron - awaiting results of  FOBT, reports pcp started iron and repeat labs within next few weeks. May possibly get GI referal.  - denies any palpitations       Other medical problems not addressed this visit   2. HTN - compliant with meds     3. Migraine - has been on verpamil for prevension   4. Anemia - Hgb is 8.3 05/2022, new diagnosis - repeat cbc coming up.    Former EMT Past Medical History:  Diagnosis Date   Arrhythmia    Arthritis    Asthma    Atrial fibrillation ([place])    Chronic kidney disease    Depression    Essential tremor    Fibromyalgia    Headache    Hypertension    Osteopenia    Thyroid disease    Past Surgical History:  Procedure Laterality Date   CHOLECYSTECTOMY     GYNECOLOGIC CRYOSURGERY     kidney stone removed       Current Meds  Medication Sig   albuterol (PROVENTIL HFA;VENTOLIN HFA) 108 (90 BASE) MCG/ACT inhaler Inhale 2 puffs into the lungs every 6 (six) hours as needed for wheezing.   aspirin-acetaminophen-caffeine (EXCEDRIN MIGRAINE) 250-250-65 MG tablet Take 1 tablet by mouth every 6 (six) hours as needed for headache.   b complex vitamins tablet Take 1 tablet by mouth daily.   Chlorpheniramine Maleate (ALLERGY RELIEF PO) Take 1 tablet by mouth daily as needed.   Cholecalciferol 125 MCG (5000 UT) TABS Take 1  tablet by mouth daily.   citalopram (CELEXA) 40 MG tablet Take 40 mg by mouth daily.   levothyroxine (SYNTHROID, LEVOTHROID) 75 MCG tablet Take 75 mcg by mouth daily before breakfast.   MELATONIN GUMMIES PO Take 1 tablet by mouth as needed.   montelukast (SINGULAIR) 10 MG tablet Take 10 mg by mouth daily.    Multiple Vitamin (MULTIVITAMIN) tablet Take 1 tablet by mouth daily.   Omeprazole Magnesium (PRILOSEC OTC PO) Take 1 capsule by mouth 2 (two) times daily.   propranolol (INDERAL) 10 MG tablet Take 1 tablet (10 mg total) by mouth 2 (two) times daily.   rizatriptan (MAXALT) 10 MG tablet Take 10 mg by mouth 3 (three) times daily as needed.   verapamil  (CALAN-SR) 240 MG CR tablet Take 0.5 tablets (120 mg total) by mouth in the morning. & 240 mg in the evening   vitamin C (ASCORBIC ACID) 500 MG tablet Take 500 mg by mouth every other day.      Allergies:   Molds & smuts, Other, Biaxin [clarithromycin], Ciprofloxacin, Cymbalta [duloxetine hcl], Levaquin [levofloxacin in d5w], Naproxen, Penicillins, Prozac [fluoxetine hcl], Tequin [gatifloxacin], and Vicodin [hydrocodone-acetaminophen]   Social History   Tobacco Use   Smoking status: Never   Smokeless tobacco: Never  Vaping Use   Vaping Use: Never used  Substance Use Topics   Alcohol use: Yes    Comment: Occasional   Drug use: No     Family Hx: The patient's family history includes Cancer - Other in her mother; Dementia in her father, mother, and sister; Depression in her brother, brother, brother, father, and another family member; Hyperlipidemia in her father; Hypertension in her father and mother; Osteoporosis in an other family member; Stroke in her father and mother.  ROS:   Please see the history of present illness.     All other systems reviewed and are negative.   Prior CV studies:   The following studies were reviewed today:  09/2013 echo Study Conclusions   - Study data: Technically difficult study.  - Left ventricle: The cavity size was normal. Wall thickness    was normal. Systolic function was normal. The estimated    ejection fraction was in the range of 60% to 65%. Left    ventricular diastolic function parameters were normal.  - Aortic valve: Valve area: 2.51cm^2(VTI). Valve area:    2.61cm^2 (Vmax).  - Mitral valve: Calcified annulus. Mildly thickened leaflets   Labs/Other Tests and Data Reviewed:    EKG:  No ECG reviewed.  Recent Labs: No results found for requested labs within last 365 days.   Recent Lipid Panel No results found for: "CHOL", "TRIG", "HDL", "CHOLHDL", "LDLCALC", "LDLDIRECT"  Wt Readings from Last 3 Encounters:  09/22/22 192 lb  (87.1 kg)  07/03/22 191 lb 3.2 oz (86.7 kg)  02/27/22 187 lb (84.8 kg)          Objective:    Vital Signs:  BP 128/76   Pulse 68   Ht 5\' 5"  (1.651 m)   Wt 192 lb (87.1 kg)   BMI 31.95 kg/m    NOrmal affect. Normal speech pattern and tone. Comfortable, no apparent distress. No audible signs of wheezing or SOB.   ASSESSMENT & PLAN:    1.PAF -no symptoms - not on anticoag due to ongoing issues and workup for iron deficient anemia. Last Hgb 8.6, ongoing evaluation by pcp - we will continue to follow, phone visit 6 weeks to reassess candidacy for anticoagulation. At this  time would continue to hold due to ongoing signficant anemia        Time:   Today, I have spent 14 minutes with the patient with telehealth technology discussing the above problems.     Medication Adjustments/Labs and Tests Ordered: Current medicines are reviewed at length with the patient today.  Concerns regarding medicines are outlined above.   Tests Ordered: No orders of the defined types were placed in this encounter.   Medication Changes: No orders of the defined types were placed in this encounter.   Follow Up:  Virtual Visit  6 weeks  Signed, Carlyle Dolly, MD  09/22/2022 2:01 PM    [place]

## 2022-09-22 NOTE — Patient Instructions (Signed)
Medication Instructions:  Your physician recommends that you continue on your current medications as directed. Please refer to the Current Medication list given to you today.   Labwork: None  Testing/Procedures: None  Follow-Up: Virtual Visit with Dr. Harl Bowie in 6 weeks.   Any Other Special Instructions Will Be Listed Below (If Applicable).     If you need a refill on your cardiac medications before your next appointment, please call your pharmacy.

## 2022-09-23 DIAGNOSIS — Z1212 Encounter for screening for malignant neoplasm of rectum: Secondary | ICD-10-CM | POA: Diagnosis not present

## 2022-09-23 DIAGNOSIS — Z1211 Encounter for screening for malignant neoplasm of colon: Secondary | ICD-10-CM | POA: Diagnosis not present

## 2022-10-03 LAB — EXTERNAL GENERIC LAB PROCEDURE: COLOGUARD: NEGATIVE

## 2022-10-03 LAB — COLOGUARD: COLOGUARD: NEGATIVE

## 2022-10-11 DIAGNOSIS — Z23 Encounter for immunization: Secondary | ICD-10-CM | POA: Diagnosis not present

## 2022-10-20 DIAGNOSIS — D649 Anemia, unspecified: Secondary | ICD-10-CM | POA: Diagnosis not present

## 2022-10-28 DIAGNOSIS — I10 Essential (primary) hypertension: Secondary | ICD-10-CM | POA: Diagnosis not present

## 2022-10-28 DIAGNOSIS — D649 Anemia, unspecified: Secondary | ICD-10-CM | POA: Diagnosis not present

## 2022-10-28 DIAGNOSIS — Z23 Encounter for immunization: Secondary | ICD-10-CM | POA: Diagnosis not present

## 2022-10-28 DIAGNOSIS — Z299 Encounter for prophylactic measures, unspecified: Secondary | ICD-10-CM | POA: Diagnosis not present

## 2022-10-28 DIAGNOSIS — R519 Headache, unspecified: Secondary | ICD-10-CM | POA: Diagnosis not present

## 2022-10-28 DIAGNOSIS — G4730 Sleep apnea, unspecified: Secondary | ICD-10-CM | POA: Diagnosis not present

## 2022-11-06 ENCOUNTER — Ambulatory Visit: Payer: Medicare Other | Attending: Student | Admitting: Student

## 2022-11-06 ENCOUNTER — Encounter: Payer: Self-pay | Admitting: Student

## 2022-11-06 VITALS — BP 117/68 | HR 62 | Ht 64.5 in | Wt 196.0 lb

## 2022-11-06 DIAGNOSIS — D509 Iron deficiency anemia, unspecified: Secondary | ICD-10-CM | POA: Diagnosis not present

## 2022-11-06 DIAGNOSIS — I10 Essential (primary) hypertension: Secondary | ICD-10-CM | POA: Diagnosis not present

## 2022-11-06 DIAGNOSIS — I480 Paroxysmal atrial fibrillation: Secondary | ICD-10-CM | POA: Diagnosis not present

## 2022-11-06 MED ORDER — APIXABAN 5 MG PO TABS: 5.0000 mg | ORAL_TABLET | Freq: Two times a day (BID) | ORAL | 3 refills | Status: DC

## 2022-11-06 NOTE — Patient Instructions (Signed)
Medication Instructions:  Re-start Eliquis 5 mg twice a day   Labwork: None today  Testing/Procedures: None today  Follow-Up: 6 months  Any Other Special Instructions Will Be Listed Below (If Applicable).  If you need a refill on your cardiac medications before your next appointment, please call your pharmacy.

## 2022-11-06 NOTE — Progress Notes (Signed)
Virtual Visit via Telephone Note   Because of Joy Hawkins's co-morbid illnesses, she is at least at moderate risk for complications without adequate follow up.  This format is felt to be most appropriate for this patient at this time.  The patient did not have access to video technology/had technical difficulties with video requiring transitioning to audio format only (telephone).  All issues noted in this document were discussed and addressed.  No physical exam could be performed with this format.  Please refer to the patient's chart for her consent to telehealth for [facility].   Date:  11/06/2022   ID:  Joy Hawkins, DOB Oct 09, 1946, MRN [id number] The patient was identified using 2 identifiers.  Patient Location: Home Provider Location: Office/Clinic  PCP:  Kirstie Peri, MD   [facility] Providers Cardiologist:  Dina Rich, MD     Evaluation Performed:  Follow-Up Visit  Chief Complaint: No specific issues  History of Present Illness:    Joy Hawkins is a 76 y.o. female with past medical history of paroxysmal atrial fibrillation, HTN, hypothyroidism, fibromyalgia and anemia who presents for a 6-week follow-up telehealth visit.  She most recently had a telehealth visit with Dr. Wyline Mood in 09/2022 and denied any recent palpitations at that time. She had been off anticoagulation due to recent anemia and was undergoing work-up for this by her PCP. Her Hgb had been down to 8.6 in 06/2022. By review of records, she did have a Cologuard test in 09/2022 which was negative. She did have repeat labs on 10/20/2022 which showed her Hgb had normalized to 13.8 with platelets at 291 K.   In talking with the patient today, she reports overall doing well since her last visit. Her activity is limited secondary to fibromyalgia and she currently resides at an [employer] She does enjoy going out to eat with friends and going shopping. She  reports her atrial fibrillation has overall been well-controlled and she has not experienced palpitations for the past few months. Reports her respiratory status has been stable with no specific orthopnea or PND. No reports of active bleeding and she is interested in restarting Eliquis given a strong family history of CVA's.   Past Medical History:  Diagnosis Date   Arrhythmia    Arthritis    Asthma    Atrial fibrillation (HCC)    Chronic kidney disease    Depression    Essential tremor    Fibromyalgia    Headache    Hypertension    Osteopenia    Thyroid disease    Past Surgical History:  Procedure Laterality Date   CHOLECYSTECTOMY     GYNECOLOGIC CRYOSURGERY     kidney stone removed       Current Meds  Medication Sig   albuterol (PROVENTIL HFA;VENTOLIN HFA) 108 (90 BASE) MCG/ACT inhaler Inhale 2 puffs into the lungs every 6 (six) hours as needed for wheezing.   apixaban (ELIQUIS) 5 MG TABS tablet Take 1 tablet (5 mg total) by mouth 2 (two) times daily.   aspirin-acetaminophen-caffeine (EXCEDRIN MIGRAINE) 250-250-65 MG tablet Take 1 tablet by mouth every 6 (six) hours as needed for headache.   b complex vitamins tablet Take 1 tablet by mouth daily.   Chlorpheniramine Maleate (ALLERGY RELIEF PO) Take 1 tablet by mouth daily as needed.   Cholecalciferol 125 MCG (5000 UT) TABS Take 1 tablet by mouth daily.   citalopram (CELEXA) 40 MG tablet Take 40 mg by mouth daily.  FEROSUL 325 (65 Fe) MG tablet Take 325 mg by mouth daily with breakfast.   levothyroxine (SYNTHROID, LEVOTHROID) 75 MCG tablet Take 75 mcg by mouth daily before breakfast.   MELATONIN GUMMIES PO Take 1 tablet by mouth as needed.   montelukast (SINGULAIR) 10 MG tablet Take 10 mg by mouth daily.    Multiple Vitamin (MULTIVITAMIN) tablet Take 1 tablet by mouth daily.   Omeprazole Magnesium (PRILOSEC OTC PO) Take 1 capsule by mouth daily.   propranolol (INDERAL) 10 MG tablet Take 1 tablet (10 mg total) by mouth 2 (two)  times daily.   rizatriptan (MAXALT) 10 MG tablet Take 10 mg by mouth 3 (three) times daily as needed.   verapamil (CALAN-SR) 240 MG CR tablet Take 0.5 tablets (120 mg total) by mouth in the morning. & 240 mg in the evening   vitamin C (ASCORBIC ACID) 500 MG tablet Take 500 mg by mouth every other day.      Allergies:   Molds & smuts, Other, Biaxin [clarithromycin], Ciprofloxacin, Cymbalta [duloxetine hcl], Levaquin [levofloxacin in d5w], Naproxen, Penicillins, Prozac [fluoxetine hcl], Tequin [gatifloxacin], and Vicodin [hydrocodone-acetaminophen]   Social History   Tobacco Use   Smoking status: Never   Smokeless tobacco: Never  Vaping Use   Vaping Use: Never used  Substance Use Topics   Alcohol use: Yes    Comment: Occasional   Drug use: No     Family Hx: The patient's family history includes Cancer - Other in her mother; Dementia in her father, mother, and sister; Depression in her brother, brother, brother, father, and another family member; Hyperlipidemia in her father; Hypertension in her father and mother; Osteoporosis in an other family member; Stroke in her father and mother.  ROS:   Please see the history of present illness.     All other systems reviewed and are negative.   Prior CV studies:   The following studies were reviewed today:  Echocardiogram: 09/2013 Study Conclusions   - Study data: Technically difficult study.  - Left ventricle: The cavity size was normal. Wall thickness    was normal. Systolic function was normal. The estimated    ejection fraction was in the range of 60% to 65%. Left    ventricular diastolic function parameters were normal.  - Aortic valve: Valve area: 2.51cm^2(VTI). Valve area:    2.61cm^2 (Vmax).  - Mitral valve: Calcified annulus. Mildly thickened leaflets   NST: 09/2013 IMPRESSION:  Low risk Lexiscan Cardiolite. No diagnostic ST segment abnormalities  were noted. Perfusion imaging is consistent with breast attenuation,  no  clear evidence of scar or ischemia. LVEF is normal at 75% with  normal LV volumes and no wall motion abnormalities.   Labs/Other Tests and Data Reviewed:    EKG:  An ECG dated 02/20/2022 was personally reviewed today and demonstrated:  Normal sinus rhythm, heart rate 81 with no acute ST changes.  Recent Labs: No results found for requested labs within last 365 days.   Recent Lipid Panel No results found for: "CHOL", "TRIG", "HDL", "CHOLHDL", "LDLCALC", "LDLDIRECT"  Wt Readings from Last 3 Encounters:  11/06/22 196 lb (88.9 kg)  09/22/22 192 lb (87.1 kg)  07/03/22 191 lb 3.2 oz (86.7 kg)     Objective:    Vital Signs:  BP 117/68   Pulse 62   Ht 5' 4.5" (1.638 m)   Wt 196 lb (88.9 kg)   BMI 33.12 kg/m    General: Pleasant female sounding in NAD Psych: Normal affect. Neuro: Alert  and oriented X 3. Lungs:  Resp regular and unlabored while talking on the phone.    ASSESSMENT & PLAN:    1. Paroxysmal Atrial Fibrillation - She denies any palpitations over the past few months and says her heart rate has been well controlled when checked at home. Continue current medical therapy with Propanolol 10 mg twice daily (on this for essential tremors as well) along with Verapamil 120 mg in AM/240 mg in PM. - Given improvement in her hemoglobin since being started on iron supplementation, will plan to restart anticoagulation with Eliquis 5 mg twice daily. She is scheduled to follow-up with her PCP and have repeat labs in 12/2022. I encouraged her to make [place] aware if these are not obtained at that time as we can order a repeat CBC.  2.  HTN - Her blood pressure has been well-controlled, at 117/68 on most recent check. Continue current medical therapy.  3. Anemia - Her Hgb was previously at 8.6 in 06/2022 and she was started on iron supplementation. Hgb improved to 13.8 by recent labs last month.   Time:   Today, I have spent 15 minutes with the patient with telehealth technology  discussing the above problems.     Medication Adjustments/Labs and Tests Ordered: Current medicines are reviewed at length with the patient today.  Concerns regarding medicines are outlined above.   Tests Ordered: No orders of the defined types were placed in this encounter.   Medication Changes: Meds ordered this encounter  Medications   apixaban (ELIQUIS) 5 MG TABS tablet    Sig: Take 1 tablet (5 mg total) by mouth 2 (two) times daily.    Dispense:  180 tablet    Refill:  3    Order Specific Question:   Supervising Provider    Answer:   Arnoldo Lenis [id number]    Follow Up:  In Person in 6 month(s)  Signed, Erma Heritage, PA-C  11/06/2022 4:29 PM    [place]

## 2022-11-09 ENCOUNTER — Telehealth: Payer: Self-pay | Admitting: Pharmacist Clinician (PhC)/ Clinical Pharmacy Specialist

## 2022-11-09 NOTE — Telephone Encounter (Signed)
    Yes, can switch from Eliquis to Xarelto if more affordable for her.   Thanks,  [place]

## 2022-11-09 NOTE — Telephone Encounter (Signed)
Xarelto appears to be the preferred DOCA for her prescription plan.    Based on SCr of 0.94 in June, her CrCl is 74, therefore would be on Xarelto 20 mg daily with meal.    Okay to switch med?

## 2022-11-11 DIAGNOSIS — G4733 Obstructive sleep apnea (adult) (pediatric): Secondary | ICD-10-CM | POA: Diagnosis not present

## 2022-11-11 NOTE — Telephone Encounter (Signed)
   There is no interaction between Propranolol and Xarelto but in looking back at old notes from 2015, it appears she had a headache with Xarelto. Can continue with Eliquis for now and if there are insurance coverage issues in the future, can readdress switching at that time.   Signed, Ellsworth Lennox, PA-C 11/11/2022, 10:33 AM

## 2022-11-11 NOTE — Telephone Encounter (Signed)
Patient is returning call.  °

## 2022-11-11 NOTE — Telephone Encounter (Signed)
Patient states she will stay on Eliquis for now.

## 2022-11-11 NOTE — Telephone Encounter (Signed)
Left message to return call 

## 2022-11-11 NOTE — Telephone Encounter (Signed)
Joy Hawkins said she was told not to take propranolol with Xarelto and that she believes she was also on Xarelto before and they switched her to Eliquis for some unknown reason.

## 2022-11-21 ENCOUNTER — Other Ambulatory Visit: Payer: Self-pay | Admitting: Pharmacist

## 2022-11-21 MED ORDER — APIXABAN 5 MG PO TABS: 5.0000 mg | ORAL_TABLET | Freq: Two times a day (BID) | ORAL | 1 refills | Status: DC

## 2023-01-06 ENCOUNTER — Other Ambulatory Visit: Payer: Self-pay | Admitting: Cardiology

## 2023-02-03 ENCOUNTER — Other Ambulatory Visit: Payer: Self-pay | Admitting: Cardiology

## 2023-02-19 DIAGNOSIS — G4730 Sleep apnea, unspecified: Secondary | ICD-10-CM | POA: Diagnosis not present

## 2023-02-19 DIAGNOSIS — Z299 Encounter for prophylactic measures, unspecified: Secondary | ICD-10-CM | POA: Diagnosis not present

## 2023-02-19 DIAGNOSIS — Z0000 Encounter for general adult medical examination without abnormal findings: Secondary | ICD-10-CM | POA: Diagnosis not present

## 2023-02-19 DIAGNOSIS — I480 Paroxysmal atrial fibrillation: Secondary | ICD-10-CM | POA: Diagnosis not present

## 2023-02-19 DIAGNOSIS — Z7189 Other specified counseling: Secondary | ICD-10-CM | POA: Diagnosis not present

## 2023-02-19 DIAGNOSIS — I10 Essential (primary) hypertension: Secondary | ICD-10-CM | POA: Diagnosis not present

## 2023-02-26 DIAGNOSIS — D649 Anemia, unspecified: Secondary | ICD-10-CM | POA: Diagnosis not present

## 2023-02-26 DIAGNOSIS — E7800 Pure hypercholesterolemia, unspecified: Secondary | ICD-10-CM | POA: Diagnosis not present

## 2023-02-26 DIAGNOSIS — Z79899 Other long term (current) drug therapy: Secondary | ICD-10-CM | POA: Diagnosis not present

## 2023-02-26 DIAGNOSIS — I480 Paroxysmal atrial fibrillation: Secondary | ICD-10-CM | POA: Diagnosis not present

## 2023-02-26 DIAGNOSIS — R5383 Other fatigue: Secondary | ICD-10-CM | POA: Diagnosis not present

## 2023-05-01 ENCOUNTER — Ambulatory Visit: Payer: Medicare Other | Admitting: Cardiology

## 2023-05-13 DIAGNOSIS — D649 Anemia, unspecified: Secondary | ICD-10-CM | POA: Diagnosis not present

## 2023-05-13 DIAGNOSIS — E039 Hypothyroidism, unspecified: Secondary | ICD-10-CM | POA: Diagnosis not present

## 2023-06-02 ENCOUNTER — Ambulatory Visit
Admission: RE | Admit: 2023-06-02 | Discharge: 2023-06-02 | Disposition: A | Discharge status: Home / Self Care | Payer: Medicare Other | Source: Ambulatory Visit | Attending: Internal Medicine | Admitting: Internal Medicine

## 2023-06-02 ENCOUNTER — Other Ambulatory Visit: Payer: Self-pay | Admitting: Internal Medicine

## 2023-06-02 DIAGNOSIS — Z1231 Encounter for screening mammogram for malignant neoplasm of breast: Secondary | ICD-10-CM

## 2023-07-07 ENCOUNTER — Other Ambulatory Visit: Payer: Self-pay

## 2023-07-08 NOTE — Progress Notes (Addendum)
Cardiology Office Note    Date:  07/09/2023  ID:  JOMARA VOLKOV, DOB 1946/04/13, MRN [id number] Cardiologist: Dina Rich, MD    History of Present Illness:    Joy Hawkins is a 77 y.o. female with past medical history of paroxysmal atrial fibrillation, HTN, hypothyroidism, fibromyalgia and anemia who presents to the office today for 28-month follow-up.  She most recently had a telehealth visit with myself in 10/2022 and denied any recent anginal symptoms at that time. She previously had issues with anemia but her hemoglobin had normalized by follow-up labs, therefore it was recommended to restart Eliquis 5 mg twice daily for anticoagulation in the setting of her atrial fibrillation. She was continued on Propranolol 10 mg twice daily (on this for essential tremors as well) and Verapamil 120 mg in AM/240 mg in PM for rate-control.  In talking with the patient today, she reports things have overall been going well since her last office visit.  She does reside in independent living at [employer] and enjoys this. Still drives locally. She does report occasional palpitations and she has a Kardia monitor that she checks which confirms when she is in atrial fibrillation. Says the highest her heart rate typically gets is in the 90's when in atrial fibrillation. She does have occasional episodes of bradycardia with heart rate in the 40's to 50's. Reports this has overall been stable and has not increased in frequency or severity. Remains on Eliquis for anticoagulation with no reports of active bleeding. She denies any recent chest pain, orthopnea, PND or pitting edema. Has not used her rescue inhaler in years.   Studies Reviewed:   EKG: EKG is ordered today and demonstrates:    EKG Interpretation Date/Time:  Thursday July 09 2023 14:01:12 EDT Ventricular Rate:  60 PR Interval:    QRS Duration:  78 QT Interval:  444 QTC Calculation: 444 R Axis:   15  Text Interpretation: Normal  sinus rhythm No significant change was found Confirmed by Randall An ([id number]) on 07/09/2023 2:03:27 PM       NST: 09/2013 IMPRESSION:  Low risk Lexiscan Cardiolite. No diagnostic ST segment abnormalities  were noted. Perfusion imaging is consistent with breast attenuation,  no clear evidence of scar or ischemia. LVEF is normal at 75% with  normal LV volumes and no wall motion abnormalities.    Risk Assessment/Calculations:    CHA2DS2-VASc Score = 3   This indicates a 3.2% annual risk of stroke. The patient's score is based upon: CHF History: 0 HTN History: 1 Diabetes History: 0 Stroke History: 0 Vascular Disease History: 0 Age Score: 1 Gender Score: 1    Physical Exam:   VS:  BP 132/80   Pulse 60   Ht 5' 4.5" (1.638 m)   Wt 207 lb 12.8 oz (94.3 kg)   SpO2 97%   BMI 35.12 kg/m    Wt Readings from Last 3 Encounters:  07/09/23 207 lb 12.8 oz (94.3 kg)  11/06/22 196 lb (88.9 kg)  09/22/22 192 lb (87.1 kg)     GEN: Well nourished, well developed female appearing in no acute distress NECK: No JVD; No carotid bruits CARDIAC: RRR, no murmurs, rubs, gallops RESPIRATORY:  Clear to auscultation without rales, wheezing or rhonchi  ABDOMEN: Appears non-distended. No obvious abdominal masses. EXTREMITIES: No clubbing or cyanosis. No pitting edema.  Distal pedal pulses are 2+ bilaterally.   Assessment and Plan:   1. Paroxysmal Atrial Fibrillation/Use of Long-term Anticoagulation - She does report  occasional palpitations as discussed above. We reviewed that we cannot safely titrate AV nodal blocking agents at this time given her heart rate in the 50's intermittently. I encouraged her to make [place] aware if she has more frequent episodes of atrial fibrillation as we could refer to EP for consideration of antiarrhythmic therapy. She wishes to hold off on this for now unless symptoms increase in frequency.  Continue Verapamil 120 mg in AM/240 mg in PM and Propranolol 10 mg twice  daily (has been on this for essential tremor). Will obtain a follow-up echocardiogram for reassessment of any structural abnormalities given the timeframe since last evaluation. - She remains on Eliquis 5 mg twice daily for anticoagulation. CBC in 04/2023 by review of [facility] DXA showed Hgb was at 14.3 and platelets at 212 K.   2. HTN - BP is well-controlled at 132/80 during today's visit. Continue current medical therapy with Propranolol 10 mg twice daily and Verapamil 120 mg in AM/240 mg in PM.  3. Anemia - Labs in 04/2023 showed her Hgb was stable at 14.3. Remains on iron supplementation.   Signed, Ellsworth Lennox, PA-C

## 2023-07-09 ENCOUNTER — Telehealth: Payer: Self-pay | Admitting: Student

## 2023-07-09 ENCOUNTER — Encounter: Payer: Self-pay | Admitting: Student

## 2023-07-09 ENCOUNTER — Ambulatory Visit: Payer: Medicare Other | Attending: Cardiology | Admitting: Student

## 2023-07-09 VITALS — BP 132/80 | HR 60 | Ht 64.5 in | Wt 207.8 lb

## 2023-07-09 DIAGNOSIS — Z7901 Long term (current) use of anticoagulants: Secondary | ICD-10-CM

## 2023-07-09 DIAGNOSIS — I10 Essential (primary) hypertension: Secondary | ICD-10-CM

## 2023-07-09 DIAGNOSIS — I480 Paroxysmal atrial fibrillation: Secondary | ICD-10-CM | POA: Diagnosis not present

## 2023-07-09 DIAGNOSIS — D509 Iron deficiency anemia, unspecified: Secondary | ICD-10-CM | POA: Diagnosis not present

## 2023-07-09 MED ORDER — VERAPAMIL HCL ER 240 MG PO TBCR: EXTENDED_RELEASE_TABLET | ORAL | 3 refills | Status: DC

## 2023-07-09 MED ORDER — PROPRANOLOL HCL 10 MG PO TABS: 10.0000 mg | ORAL_TABLET | Freq: Two times a day (BID) | ORAL | 3 refills | Status: DC

## 2023-07-09 MED ORDER — APIXABAN 5 MG PO TABS: 5.0000 mg | ORAL_TABLET | Freq: Two times a day (BID) | ORAL | 3 refills | Status: DC

## 2023-07-09 NOTE — Patient Instructions (Signed)
Medication Instructions:  Your physician recommends that you continue on your current medications as directed. Please refer to the Current Medication list given to you today.  *If you need a refill on your cardiac medications before your next appointment, please call your pharmacy*   Lab Work: NONE   If you have labs (blood work) drawn today and your tests are completely normal, you will receive your results only by: MyChart Message (if you have MyChart) OR A paper copy in the mail If you have any lab test that is abnormal or we need to change your treatment, we will call you to review the results.   Testing/Procedures: Your physician has requested that you have an echocardiogram. Echocardiography is a painless test that uses sound waves to create images of your heart. It provides your doctor with information about the size and shape of your heart and how well your heart's chambers and valves are working. This procedure takes approximately one hour. There are no restrictions for this procedure. Please do NOT wear cologne, perfume, aftershave, or lotions (deodorant is allowed). Please arrive 15 minutes prior to your appointment time.    Follow-Up: At [facility], you and your health needs are our priority.  As part of our continuing mission to provide you with exceptional heart care, we have created designated Provider Care Teams.  These Care Teams include your primary Cardiologist (physician) and Advanced Practice Providers (APPs -  Physician Assistants and Nurse Practitioners) who all work together to provide you with the care you need, when you need it.  We recommend signing up for the patient portal called "MyChart".  Sign up information is provided on this After Visit Summary.  MyChart is used to connect with patients for Virtual Visits (Telemedicine).  Patients are able to view lab/test results, encounter notes, upcoming appointments, etc.  Non-urgent messages can be sent to  your provider as well.   To learn more about what you can do with MyChart, go to [email or web address].    Your next appointment:   6 month(s)  Provider:   You may see Dina Rich, MD or one of the following Advanced Practice Providers on your designated Care Team:   Randall An, PA-C  Jacolyn Reedy, [place]     Other Instructions Thank you for choosing [facility]!

## 2023-07-09 NOTE — Telephone Encounter (Signed)
Checking percert on the following patient for testing scheduled at [facility].    ECHO 09/01/2023

## 2023-08-27 ENCOUNTER — Other Ambulatory Visit: Payer: Self-pay | Admitting: Oncology

## 2023-08-27 DIAGNOSIS — Z006 Encounter for examination for normal comparison and control in clinical research program: Secondary | ICD-10-CM

## 2023-09-01 ENCOUNTER — Ambulatory Visit (HOSPITAL_COMMUNITY)
Admission: RE | Admit: 2023-09-01 | Discharge: 2023-09-01 | Disposition: A | Discharge status: Home / Self Care | Payer: Medicare Other | Source: Ambulatory Visit | Attending: Student | Admitting: Student

## 2023-09-01 DIAGNOSIS — I480 Paroxysmal atrial fibrillation: Secondary | ICD-10-CM | POA: Diagnosis not present

## 2023-09-01 LAB — ECHOCARDIOGRAM COMPLETE
Area-P 1/2: 5.02 cm2
P 1/2 time: 663 ms
S' Lateral: 2.6 cm

## 2023-09-01 NOTE — Progress Notes (Signed)
*  PRELIMINARY RESULTS* Echocardiogram 2D Echocardiogram has been performed.  Stacey Drain 09/01/2023, 4:04 PM

## 2023-09-04 ENCOUNTER — Telehealth: Payer: Self-pay | Admitting: Cardiology

## 2023-09-04 NOTE — Telephone Encounter (Signed)
Patient notified and verbalized understanding. Patient had no questions or concerns at this time. PCP copied

## 2023-09-04 NOTE — Telephone Encounter (Signed)
-----   Message from Ellsworth Lennox sent at 09/02/2023  9:54 AM EDT ----- Please let the patient know her echocardiogram shows normal pumping function of her heart with an ejection fraction of 55-60%. No wall motion abnormalities. She does have mild thickness of the heart muscle and trivial leakage along the the mitral valve but no significant abnormalities. Overall, a reassuring study!

## 2023-09-04 NOTE — Telephone Encounter (Signed)
Patient is returning call to discuss echo results.

## 2023-09-29 DIAGNOSIS — G720 Drug-induced myopathy: Secondary | ICD-10-CM | POA: Diagnosis not present

## 2023-09-29 DIAGNOSIS — I10 Essential (primary) hypertension: Secondary | ICD-10-CM | POA: Diagnosis not present

## 2023-09-29 DIAGNOSIS — Z299 Encounter for prophylactic measures, unspecified: Secondary | ICD-10-CM | POA: Diagnosis not present

## 2023-09-29 DIAGNOSIS — Z0000 Encounter for general adult medical examination without abnormal findings: Secondary | ICD-10-CM | POA: Diagnosis not present

## 2023-09-29 DIAGNOSIS — T466X5A Adverse effect of antihyperlipidemic and antiarteriosclerotic drugs, initial encounter: Secondary | ICD-10-CM | POA: Diagnosis not present

## 2023-09-29 DIAGNOSIS — Z23 Encounter for immunization: Secondary | ICD-10-CM | POA: Diagnosis not present

## 2023-09-30 ENCOUNTER — Other Ambulatory Visit: Payer: Self-pay | Admitting: *Deleted

## 2023-09-30 MED ORDER — APIXABAN 5 MG PO TABS: 5.0000 mg | ORAL_TABLET | Freq: Two times a day (BID) | ORAL | 1 refills | Status: DC

## 2023-09-30 NOTE — Telephone Encounter (Signed)
Prescription refill request for Eliquis received. Indication: PAF Last office visit: 07/09/23  B Strader PA-C Scr: 0.85 on 02/26/23  KPN Age: 77 Weight: 94.3kg  Based on above findings Eliquis 5mg  twice daily is the appropriate dose.  Refill approved.

## 2023-11-04 ENCOUNTER — Telehealth: Payer: Self-pay | Admitting: Cardiology

## 2023-11-04 ENCOUNTER — Ambulatory Visit: Payer: Medicare Other | Attending: Student

## 2023-11-04 DIAGNOSIS — I480 Paroxysmal atrial fibrillation: Secondary | ICD-10-CM

## 2023-11-04 NOTE — Telephone Encounter (Signed)
Patient c/o Palpitations:  STAT if patient reporting lightheadedness, shortness of breath, or chest pain  How long have you had palpitations/irregular HR/ Afib? Are you having the symptoms now?   Yes - mild  Are you currently experiencing lightheadedness, SOB or CP?  Lightheaded but patient states this is normal for her  Do you have a history of afib (atrial fibrillation) or irregular heart rhythm?   Yes  Have you checked your BP or HR? (document readings if available):   HR 64  BP not available yet  Are you experiencing any other symptoms?  No   Patient stated her Afib symptoms have increased in frequency and duration.

## 2023-11-04 NOTE — Telephone Encounter (Signed)
   She has a known history of paroxysmal atrial fibrillation and at the time of her office visit in 06/2023, we reviewed that Verapamil or Propranolol could not be further titrated given her heart rate in the 40's to 50's intermittently. If she is having more frequent palpitations, I would recommend obtaining a 2-week Zio patch for further assessment and to assess her atrial fibrillation burden.  Based off results, she may require referral to Electrophysiology and consideration of antiarrhythmic therapy.  Signed, Ellsworth Lennox, PA-C 11/04/2023, 11:49 AM

## 2023-11-04 NOTE — Telephone Encounter (Signed)
Patient notified and verbalized understanding. 

## 2023-11-04 NOTE — Telephone Encounter (Signed)
Spoke to pt who stated for the last 2 weeks, palpitations have increased in frequency and duration. Pt stated that hr has ranged between bpm, but usually stays at 69 bpm. Pt stated that she does get some light headedness from time to time, which is normal for her, but that her atrial fibrillation makes it worse. Pt stated that when she has recurrent palpitations she also feels like she has a weight sitting on her chest. Pt stated that she is compliant with both Verapamil and Propanolol.   Please advise.

## 2023-11-07 DIAGNOSIS — I480 Paroxysmal atrial fibrillation: Secondary | ICD-10-CM

## 2023-12-03 ENCOUNTER — Telehealth: Payer: Self-pay | Admitting: Student

## 2023-12-03 DIAGNOSIS — I480 Paroxysmal atrial fibrillation: Secondary | ICD-10-CM | POA: Diagnosis not present

## 2023-12-03 DIAGNOSIS — R001 Bradycardia, unspecified: Secondary | ICD-10-CM

## 2023-12-03 NOTE — Telephone Encounter (Signed)
New message:    She has an abnormal Zio results.

## 2023-12-03 NOTE — Telephone Encounter (Signed)
Monitor is completed and scanned to Dr.Branch for review.

## 2023-12-03 NOTE — Telephone Encounter (Signed)
Zio by [employer] is calling to report an abnormal Zio monitor  

## 2023-12-03 NOTE — Telephone Encounter (Signed)
Spoke with Romualdo Bolk R. @[employer]  who called to report patient having 3 episodes of symptomatic bradycardia. See result below. 39 bpm for 30 seconds 11/10/23 @5 :41 am page 18 strip #4 2. 37 bpm for 30 seconds 11/12/23 @5 :33 am page 18 strip #5 3. 39 bpm for 30 seconds 11/14/23 @6 :49 am page 19 strip #7  Also had an episode of slow A-fib @ 36 bpm for 60 seconds on 11/21/2023 @5 :24 am page 22 strip #14  Result of monitor already uploaded to chart and sent to provider for review.

## 2023-12-03 NOTE — Telephone Encounter (Signed)
We are aware, monitor was processed today and sent to Dr.Branch to read.  B.Strader,PA-C has copy of report, [employer] made aware

## 2023-12-07 NOTE — Addendum Note (Signed)
Addended by: Eustace Moore on: 12/07/2023 11:06 AM   Modules accepted: Orders

## 2023-12-07 NOTE — Telephone Encounter (Signed)
Patient informed and verbalized understanding of plan. Copy sent to PCP 

## 2023-12-31 ENCOUNTER — Encounter: Payer: Self-pay | Admitting: Student

## 2023-12-31 ENCOUNTER — Ambulatory Visit: Payer: Medicare Other | Attending: Student | Admitting: Student

## 2023-12-31 VITALS — BP 142/78 | HR 70 | Ht 64.5 in | Wt 212.0 lb

## 2023-12-31 DIAGNOSIS — R001 Bradycardia, unspecified: Secondary | ICD-10-CM | POA: Diagnosis not present

## 2023-12-31 DIAGNOSIS — I480 Paroxysmal atrial fibrillation: Secondary | ICD-10-CM

## 2023-12-31 DIAGNOSIS — D509 Iron deficiency anemia, unspecified: Secondary | ICD-10-CM

## 2023-12-31 DIAGNOSIS — I10 Essential (primary) hypertension: Secondary | ICD-10-CM

## 2023-12-31 DIAGNOSIS — R0609 Other forms of dyspnea: Secondary | ICD-10-CM | POA: Diagnosis not present

## 2023-12-31 NOTE — Progress Notes (Addendum)
 Cardiology Office Note    Date:  12/31/2023  ID:  Joy Hawkins, DOB 1946-03-07, MRN [id number] Cardiologist: Alvan Carrier, MD    History of Present Illness:    Joy Hawkins is a 78 y.o. female with past medical history of paroxysmal atrial fibrillation, HTN, hypothyroidism, fibromyalgia and anemia who presents to the office today for 101-month follow-up.  She was examined by myself in 06/2023 and reported occasional palpitations and was using a Kardia monitor which confirmed she would be in atrial fibrillation at times. Given that her heart rate was in the 50's intermittently, it was reviewed that AV nodal blocking agents could not be safely titrated and she was encouraged to make us  aware if she had more frequent episodes of atrial fibrillation as EP referral would be recommended for consideration of antiarrhythmic therapy. She was continued on Verapamil  120 mg in AM/240 mg in PM and Propranolol  10 mg twice daily (has been on this for essential tremor) along with Eliquis  for anticoagulation.  She contacted the office in 10/2023 reporting more frequent palpitations over the past few weeks with associated dizziness. A 2-week Zio patch was recommended for further assessment. Her monitor showed an average heart rate of 59 bpm and she did have frequent atrial fibrillation with a 31% A-fib burden and heart rate was variable from 34 bpm to 122 bpm with an average heart rate of 54 bpm.  Results were reviewed with Dr. Alvan who recommended referral to EP for further evaluation and management.  In talking with the patient today, she reports being very symptomatic when she is in atrial fibrillation as she has palpitations, dizziness and shortness of breath. Notes this occurred frequently while she was wearing her monitor. She denies any specific orthopnea, PND or pitting edema. Does experience occasional ankle edema which is most notable along her left foot. She remains on Eliquis  for  anticoagulation with no reports of active bleeding. She does have follow-up with EP later this month to discuss further management of her atrial fibrillation. She reports being on Amiodarone in the past and did not tolerate this but I am unable to locate this in her chart and it is not listed in her medication history (listed as being on Armodafinil in the past but not Amiodarone). .  Studies Reviewed:   EKG: EKG is not ordered today.  Echocardiogram: 08/2023 IMPRESSIONS     1. Left ventricular ejection fraction, by estimation, is 55 to 60%. The  left ventricle has normal function. The left ventricle has no regional  wall motion abnormalities. There is mild concentric left ventricular  hypertrophy. Left ventricular diastolic  parameters are indeterminate.   2. Right ventricular systolic function is normal. The right ventricular  size is normal. There is normal pulmonary artery systolic pressure. The  estimated right ventricular systolic pressure is 26.6 mmHg.   3. Left atrial size was mildly dilated.   4. The mitral valve is grossly normal. Trivial mitral valve  regurgitation.   5. The aortic valve is tricuspid. Aortic valve regurgitation is mild.  Aortic regurgitation PHT measures 663 msec.   6. The inferior vena cava is normal in size with greater than 50%  respiratory variability, suggesting right atrial pressure of 3 mmHg.   Comparison(s): Prior images unable to be directly viewed.    Event Monitor: 11/2023   14 day monitor   Min HR 31, Max HR 130, Avg HR 59. Low HRs primarily in early AM hours.   Rare supraventricular ectopy in  the form of isolated PACs, couplets, triplets   Rare ventricular ectopy in the form of isolated PVCs, couplets, triplets   31% afib burden. Rates in afib 34 to 122 with averarge 54.   Symptoms correlated with afib with controlled or low rates.     Patch Wear Time:  13 days and 21 hours (2024-11-16T09:59:44-0500 to 2024-11-30T07:50:39-0500)    Patient had a min HR of 31 bpm, max HR of 130 bpm, and avg HR of 59 bpm. Predominant underlying rhythm was Sinus Rhythm. 3 Ventricular Tachycardia runs occurred, the run with the fastest interval lasting 4 beats with a max rate of 130 bpm, the longest  lasting 4 beats with an avg rate of 116 bpm. Episodes of Ventricular Tachycardia may be possible Atrial Fibrillation with aberrancy. Atrial Fibrillation occurred (31% burden), ranging from 34-122 bpm (avg of 54 bpm), the longest lasting 1 day 2 hours  with an avg rate of 56 bpm. Second Degree AV Block-Mobitz I (Wenckebach) was present. Atrial Fibrillation was detected within +/- 45 seconds of symptomatic patient event(s). Isolated SVEs were rare (<1.0%), SVE Couplets were rare (<1.0%), and SVE  Triplets were rare (<1.0%). Isolated VEs were rare (<1.0%, 6642), VE Couplets were rare (<1.0%, 104), and VE Triplets were rare (<1.0%, 6). Ventricular Bigeminy and Trigeminy were present. Difficulty discerning atrial activity during periods of slow  heart rate, making definitive diagnosis between Junctional Rhythm and Atrial Fibrillation difficult to ascertain. MD notification criteria for Symptomatic Bradycardia and Slow Atrial Fibrillation met - report posted prior to notification per account  request (AK).  Risk Assessment/Calculations:    CHA2DS2-VASc Score = 3   This indicates a 3.2% annual risk of stroke. The patient's score is based upon: CHF History: 0 HTN History: 1 Diabetes History: 0 Stroke History: 0 Vascular Disease History: 0 Age Score: 1 Gender Score: 1    Physical Exam:   VS:  BP (!) 142/78 (BP Location: Left Arm, Patient Position: Sitting, Cuff Size: Large)   Pulse 70   Ht 5' 4.5 (1.638 m)   Wt 212 lb (96.2 kg)   SpO2 97%   BMI 35.83 kg/m    Wt Readings from Last 3 Encounters:  12/31/23 212 lb (96.2 kg)  07/09/23 207 lb 12.8 oz (94.3 kg)  11/06/22 196 lb (88.9 kg)     GEN: Well nourished, well developed female appearing  in no acute distress NECK: No JVD; No carotid bruits CARDIAC: RRR, no murmurs, rubs, gallops RESPIRATORY:  Clear to auscultation without rales, wheezing or rhonchi  ABDOMEN: Appears non-distended. No obvious abdominal masses. EXTREMITIES: No clubbing or cyanosis. No pitting edema.  Distal pedal pulses are 2+ bilaterally.   Assessment and Plan:   1. Paroxysmal Atrial Fibrillation with Tachy-brady Syndrome/Use of Long-term Anticoagulation - She does report frequent palpitations and recent monitor showed a 31% atrial fibrillation burden.  Management of this has challenging in the setting of tachy-brady syndrome as she did have frequent episodes of bradycardia as well with heart rate as low as 31 bpm. She does have follow-up with the EP scheduled for later this month and we reviewed she will likely require antiarrhythmic therapy or possible PPM placement and she is very hesitant to be on additional medications given her prior intolerances. For now, we will continue current rate controlling agents with Propranolol  10 mg twice daily (has been on this for essential tremor) and Verapamil  120 mg in AM/240 mg in PM. Will recheck labs (CBC, CMET, Mg, BNP and TSH) given no recent  labs in the system.   2. HTN - Blood pressure is at 142/78 during today's visit but has overall been well-controlled when checked in her facility. Will continue current medical therapy for now with Propranolol  10 mg twice daily and Verapamil  SR 120 mg in AM/240 mg in PM.  3. Anemia - Prior CBC in 04/2023 showed hemoglobin was stable at 14.3. No reports of active bleeding. Will recheck a CBC.    Signed, Laymon CHRISTELLA Qua, PA-C

## 2023-12-31 NOTE — Patient Instructions (Signed)
 Medication Instructions:  Your physician recommends that you continue on your current medications as directed. Please refer to the Current Medication list given to you today.   Labwork: [facility]: TSH,cbc,cmet,magnesium ,bnp  Testing/Procedures: None today  Follow-Up: 6 months  Any Other Special Instructions Will Be Listed Below (If Applicable).  If you need a refill on your cardiac medications before your next appointment, please call your pharmacy.

## 2024-01-15 DIAGNOSIS — R001 Bradycardia, unspecified: Secondary | ICD-10-CM | POA: Diagnosis not present

## 2024-01-15 DIAGNOSIS — R0609 Other forms of dyspnea: Secondary | ICD-10-CM | POA: Diagnosis not present

## 2024-01-15 DIAGNOSIS — I480 Paroxysmal atrial fibrillation: Secondary | ICD-10-CM | POA: Diagnosis not present

## 2024-01-16 LAB — COMPREHENSIVE METABOLIC PANEL
ALT: 15 [IU]/L (ref 0–32)
AST: 18 [IU]/L (ref 0–40)
Albumin: 4.1 g/dL (ref 3.8–4.8)
Alkaline Phosphatase: 104 [IU]/L (ref 44–121)
BUN/Creatinine Ratio: 20 (ref 12–28)
BUN: 22 mg/dL (ref 8–27)
Bilirubin Total: 0.5 mg/dL (ref 0.0–1.2)
CO2: 23 mmol/L (ref 20–29)
Calcium: 9.4 mg/dL (ref 8.7–10.3)
Chloride: 101 mmol/L (ref 96–106)
Creatinine, Ser: 1.1 mg/dL — ABNORMAL HIGH (ref 0.57–1.00)
Globulin, Total: 2.7 g/dL (ref 1.5–4.5)
Glucose: 116 mg/dL — ABNORMAL HIGH (ref 70–99)
Potassium: 4.5 mmol/L (ref 3.5–5.2)
Sodium: 139 mmol/L (ref 134–144)
Total Protein: 6.8 g/dL (ref 6.0–8.5)
eGFR: 52 mL/min/{1.73_m2} — ABNORMAL LOW (ref >59)

## 2024-01-16 LAB — CBC
Hematocrit: 44.5 % (ref 34.0–46.6)
Hemoglobin: 14.5 g/dL (ref 11.1–15.9)
MCH: 29.7 pg (ref 26.6–33.0)
MCHC: 32.6 g/dL (ref 31.5–35.7)
MCV: 91 fL (ref 79–97)
Platelets: 215 10*3/uL (ref 150–450)
RBC: 4.88 x10E6/uL (ref 3.77–5.28)
RDW: 12.9 % (ref 11.7–15.4)
WBC: 7 10*3/uL (ref 3.4–10.8)

## 2024-01-16 LAB — MAGNESIUM: Magnesium: 1.8 mg/dL (ref 1.6–2.3)

## 2024-01-16 LAB — BRAIN NATRIURETIC PEPTIDE: BNP: 82.3 pg/mL (ref 0.0–100.0)

## 2024-01-16 LAB — TSH: TSH: 3.01 u[IU]/mL (ref 0.450–4.500)

## 2024-01-18 DIAGNOSIS — I4891 Unspecified atrial fibrillation: Secondary | ICD-10-CM | POA: Diagnosis not present

## 2024-01-18 DIAGNOSIS — I10 Essential (primary) hypertension: Secondary | ICD-10-CM | POA: Diagnosis not present

## 2024-01-18 DIAGNOSIS — J45909 Unspecified asthma, uncomplicated: Secondary | ICD-10-CM | POA: Diagnosis not present

## 2024-01-18 DIAGNOSIS — Z299 Encounter for prophylactic measures, unspecified: Secondary | ICD-10-CM | POA: Diagnosis not present

## 2024-01-20 ENCOUNTER — Encounter: Payer: Self-pay | Admitting: Internal Medicine

## 2024-01-20 ENCOUNTER — Ambulatory Visit: Payer: Medicare Other | Attending: Internal Medicine | Admitting: Internal Medicine

## 2024-01-20 VITALS — BP 130/74 | HR 69 | Ht 64.0 in | Wt 212.4 lb

## 2024-01-20 DIAGNOSIS — I159 Secondary hypertension, unspecified: Secondary | ICD-10-CM | POA: Diagnosis not present

## 2024-01-20 MED ORDER — FLECAINIDE ACETATE 50 MG PO TABS: 50.0000 mg | ORAL_TABLET | Freq: Two times a day (BID) | ORAL | 3 refills | Status: DC

## 2024-01-20 MED ORDER — PROPRANOLOL HCL 20 MG PO TABS: 20.0000 mg | ORAL_TABLET | Freq: Two times a day (BID) | ORAL | 11 refills | Status: DC

## 2024-01-20 NOTE — Patient Instructions (Signed)
Medication Instructions:   Your physician has recommended you make the following change in your medication:   Increase Propranolol to 20 mg Two Times Daily  Start Flecainide 50 mg Two Times Daily  Stop Taking Verapamil   *If you need a refill on your cardiac medications before your next appointment, please call your pharmacy*   Lab Work: NONE   If you have labs (blood work) drawn today and your tests are completely normal, you will receive your results only by: MyChart Message (if you have MyChart) OR A paper copy in the mail If you have any lab test that is abnormal or we need to change your treatment, we will call you to review the results.   Testing/Procedures: NONE     Follow-Up: At [facility], you and your health needs are our priority.  As part of our continuing mission to provide you with exceptional heart care, we have created designated Provider Care Teams.  These Care Teams include your primary Cardiologist (physician) and Advanced Practice Providers (APPs -  Physician Assistants and Nurse Practitioners) who all work together to provide you with the care you need, when you need it.  We recommend signing up for the patient portal called "MyChart".  Sign up information is provided on this After Visit Summary.  MyChart is used to connect with patients for Virtual Visits (Telemedicine).  Patients are able to view lab/test results, encounter notes, upcoming appointments, etc.  Non-urgent messages can be sent to your provider as well.   To learn more about what you can do with MyChart, go to [email or web address].    Your next appointment:   3 month(s)  Provider:   Lewayne Bunting, MD    Other Instructions Thank you for choosing [facility]!

## 2024-01-20 NOTE — Progress Notes (Signed)
HPI Joy Hawkins is referred by Randall An, PA for evaluation of atrial fib. She is a pleasant 78 yo woman who has been on both beta blockers and verapamil. She was placed on Xarelto to prevent strokes. She notes palpitations and sob when she is in atrial fib. She wore a cardiac monitor which demonstrated 31% atrial fib burden. No prolonged pauses. Her ave HR was about 60/min. No syncope.  Allergies  Allergen Reactions   Molds & Smuts Cough and Shortness Of Breath   Other Cough, Itching and Shortness Of Breath   Biaxin [Clarithromycin]    Ciprofloxacin    Cymbalta [Duloxetine Hcl]    Levaquin [Levofloxacin In D5w]     Throat swelling/rash 07/05/15   Naproxen    Penicillins    Prozac [Fluoxetine Hcl] Other (See Comments)    Weakness, irregular hearbeat   Tequin [Gatifloxacin]    Vicodin [Hydrocodone-Acetaminophen]      Current Outpatient Medications  Medication Sig Dispense Refill   albuterol (PROVENTIL HFA;VENTOLIN HFA) 108 (90 BASE) MCG/ACT inhaler Inhale 2 puffs into the lungs every 6 (six) hours as needed for wheezing.     apixaban (ELIQUIS) 5 MG TABS tablet Take 1 tablet (5 mg total) by mouth 2 (two) times daily. 180 tablet 1   aspirin-acetaminophen-caffeine (EXCEDRIN MIGRAINE) 250-250-65 MG tablet Take 1 tablet by mouth every 6 (six) hours as needed for headache.     b complex vitamins tablet Take 1 tablet by mouth daily.     Calcium Carb-Cholecalciferol (CALCIUM 1000 + D PO)      Chlorpheniramine Maleate (ALLERGY RELIEF PO) Take 1 tablet by mouth daily as needed.     Cholecalciferol 125 MCG (5000 UT) TABS Take 1 tablet by mouth daily.     citalopram (CELEXA) 40 MG tablet Take 40 mg by mouth daily.     FEROSUL 325 (65 Fe) MG tablet Take 325 mg by mouth daily with breakfast.     flecainide (TAMBOCOR) 50 MG tablet Take 1 tablet (50 mg total) by mouth 2 (two) times daily. 180 tablet 3   levothyroxine (SYNTHROID) 88 MCG tablet Take 88 mcg by mouth daily.     MELATONIN  GUMMIES PO Take 1 tablet by mouth as needed.     montelukast (SINGULAIR) 10 MG tablet Take 10 mg by mouth daily.      Multiple Vitamin (MULTIVITAMIN) tablet Take 1 tablet by mouth daily.     Omeprazole Magnesium (PRILOSEC OTC PO) Take 1 capsule by mouth daily.     propranolol (INDERAL) 20 MG tablet Take 1 tablet (20 mg total) by mouth 2 (two) times daily. 60 tablet 11   rizatriptan (MAXALT) 10 MG tablet Take 10 mg by mouth 3 (three) times daily as needed.     vitamin C (ASCORBIC ACID) 500 MG tablet Take 500 mg by mouth every other day.      No current facility-administered medications for this visit.     Past Medical History:  Diagnosis Date   Arrhythmia    Arthritis    Asthma    Atrial fibrillation (HCC)    Chronic kidney disease    Depression    Essential tremor    Fibromyalgia    Headache    Hypertension    Osteopenia    Thyroid disease     ROS:   All systems reviewed and negative except as noted in the HPI.   Past Surgical History:  Procedure Laterality Date   CHOLECYSTECTOMY  GYNECOLOGIC CRYOSURGERY     kidney stone removed       Family History  Problem Relation Age of Onset   Hypertension Mother    Stroke Mother    Cancer - Other Mother        basal cell   Dementia Mother    Hypertension Father    Stroke Father    Hyperlipidemia Father    Depression Father    Dementia Father    Dementia Sister    Depression Brother    Depression Brother    Depression Brother    Depression Other    Osteoporosis Other        family history     Social History   Socioeconomic History   Marital status: Widowed    Spouse name: Not on file   Number of children: Not on file   Years of education: Not on file   Highest education level: Not on file  Occupational History   Not on file  Tobacco Use   Smoking status: Never   Smokeless tobacco: Never  Vaping Use   Vaping status: Never Used  Substance and Sexual Activity   Alcohol use: Yes    Comment:  Occasional   Drug use: No   Sexual activity: Never  Other Topics Concern   Not on file  Social History Narrative   Not on file   Social Drivers of Health   Financial Resource Strain: Not on file  Food Insecurity: Not on file  Transportation Needs: Not on file  Physical Activity: Not on file  Stress: Not on file  Social Connections: Not on file  Intimate Partner Violence: Not on file     BP 130/74   Pulse 69   Ht 5\' 4"  (1.626 m)   Wt 212 lb 6.4 oz (96.3 kg)   SpO2 95%   BMI 36.46 kg/m   Physical Exam:  Well appearing NAD HEENT: Unremarkable Neck:  No JVD, no thyromegally Lymphatics:  No adenopathy Back:  No CVA tenderness Lungs:  Clear with no wheezes HEART:  Regular rate rhythm, no murmurs, no rubs, no clicks Abd:  soft, positive bowel sounds, no organomegally, no rebound, no guarding Ext:  2 plus pulses, no edema, no cyanosis, no clubbing Skin:  No rashes no nodules Neuro:  CN II through XII intact, motor grossly intact  EKG - nsr  Assess/Plan: PAF - she appears to be having more in the way of atrial fib. I have discussed the treatment options with the patient and recommended that she stop the verapamil, increase the propranolol and start low dose of flecainide. We will have her come in for an ecg in 3 weeks and I will see her back in 3 months.   Sharlot Gowda Trequan Marsolek,MD

## 2024-02-08 ENCOUNTER — Ambulatory Visit: Payer: Medicare Other | Admitting: Internal Medicine

## 2024-02-09 DIAGNOSIS — R9389 Abnormal findings on diagnostic imaging of other specified body structures: Secondary | ICD-10-CM | POA: Diagnosis not present

## 2024-02-09 DIAGNOSIS — I480 Paroxysmal atrial fibrillation: Secondary | ICD-10-CM | POA: Diagnosis not present

## 2024-02-09 DIAGNOSIS — I10 Essential (primary) hypertension: Secondary | ICD-10-CM | POA: Diagnosis not present

## 2024-02-09 DIAGNOSIS — Z299 Encounter for prophylactic measures, unspecified: Secondary | ICD-10-CM | POA: Diagnosis not present

## 2024-02-14 ENCOUNTER — Other Ambulatory Visit: Payer: Self-pay | Admitting: Student

## 2024-02-16 ENCOUNTER — Ambulatory Visit: Payer: Medicare Other | Attending: Internal Medicine | Admitting: *Deleted

## 2024-02-16 DIAGNOSIS — I4891 Unspecified atrial fibrillation: Secondary | ICD-10-CM

## 2024-02-16 DIAGNOSIS — Z79899 Other long term (current) drug therapy: Secondary | ICD-10-CM

## 2024-02-16 NOTE — Progress Notes (Signed)
 Patient in for EKG nurse visit. Pt states that medication change is going "[place]". Pt reports that she does not feel the Afib as much.

## 2024-02-18 ENCOUNTER — Telehealth: Payer: Self-pay | Admitting: *Deleted

## 2024-02-18 ENCOUNTER — Other Ambulatory Visit: Payer: Self-pay | Admitting: Student

## 2024-02-18 NOTE — Telephone Encounter (Signed)
 The patient has been notified of the result and verbalized understanding.  All questions (if any) were answered. Elesa Massed, LPN 4/40/3474 25:95 AM

## 2024-02-18 NOTE — Telephone Encounter (Signed)
Called patient with test results. No answer. Left message to call back.  

## 2024-02-18 NOTE — Telephone Encounter (Signed)
-----   Message from Dietrich Pates sent at 02/17/2024 11:14 PM EST ----- EKG looks good   Keep on same meds ----- Message ----- From: Kerney Elbe, LPN Sent: 2/95/6213   3:22 PM EST To: Pricilla Riffle, MD; Marinus Maw, MD

## 2024-02-29 DIAGNOSIS — Z0000 Encounter for general adult medical examination without abnormal findings: Secondary | ICD-10-CM | POA: Diagnosis not present

## 2024-02-29 DIAGNOSIS — Z713 Dietary counseling and surveillance: Secondary | ICD-10-CM | POA: Diagnosis not present

## 2024-02-29 DIAGNOSIS — I10 Essential (primary) hypertension: Secondary | ICD-10-CM | POA: Diagnosis not present

## 2024-02-29 DIAGNOSIS — R52 Pain, unspecified: Secondary | ICD-10-CM | POA: Diagnosis not present

## 2024-02-29 DIAGNOSIS — Z7189 Other specified counseling: Secondary | ICD-10-CM | POA: Diagnosis not present

## 2024-02-29 DIAGNOSIS — Z299 Encounter for prophylactic measures, unspecified: Secondary | ICD-10-CM | POA: Diagnosis not present

## 2024-02-29 DIAGNOSIS — R5383 Other fatigue: Secondary | ICD-10-CM | POA: Diagnosis not present

## 2024-02-29 DIAGNOSIS — Z79899 Other long term (current) drug therapy: Secondary | ICD-10-CM | POA: Diagnosis not present

## 2024-03-11 DIAGNOSIS — Z01818 Encounter for other preprocedural examination: Secondary | ICD-10-CM | POA: Diagnosis not present

## 2024-03-16 DIAGNOSIS — I4891 Unspecified atrial fibrillation: Secondary | ICD-10-CM | POA: Diagnosis not present

## 2024-03-16 DIAGNOSIS — G4730 Sleep apnea, unspecified: Secondary | ICD-10-CM | POA: Diagnosis not present

## 2024-03-16 DIAGNOSIS — I10 Essential (primary) hypertension: Secondary | ICD-10-CM | POA: Diagnosis not present

## 2024-03-16 DIAGNOSIS — Z7901 Long term (current) use of anticoagulants: Secondary | ICD-10-CM | POA: Diagnosis not present

## 2024-03-16 DIAGNOSIS — R9389 Abnormal findings on diagnostic imaging of other specified body structures: Secondary | ICD-10-CM | POA: Diagnosis not present

## 2024-03-16 DIAGNOSIS — E039 Hypothyroidism, unspecified: Secondary | ICD-10-CM | POA: Diagnosis not present

## 2024-03-16 DIAGNOSIS — J45909 Unspecified asthma, uncomplicated: Secondary | ICD-10-CM | POA: Diagnosis not present

## 2024-03-16 DIAGNOSIS — K219 Gastro-esophageal reflux disease without esophagitis: Secondary | ICD-10-CM | POA: Diagnosis not present

## 2024-03-16 DIAGNOSIS — Z79899 Other long term (current) drug therapy: Secondary | ICD-10-CM | POA: Diagnosis not present

## 2024-03-16 DIAGNOSIS — N362 Urethral caruncle: Secondary | ICD-10-CM | POA: Diagnosis not present

## 2024-03-23 DIAGNOSIS — Z09 Encounter for follow-up examination after completed treatment for conditions other than malignant neoplasm: Secondary | ICD-10-CM | POA: Diagnosis not present

## 2024-04-25 ENCOUNTER — Encounter: Payer: Self-pay | Admitting: Internal Medicine

## 2024-04-25 ENCOUNTER — Ambulatory Visit: Payer: Medicare Other | Attending: Internal Medicine | Admitting: Internal Medicine

## 2024-04-25 VITALS — BP 108/58 | HR 114 | Ht 64.5 in | Wt 217.0 lb

## 2024-04-25 DIAGNOSIS — I4891 Unspecified atrial fibrillation: Secondary | ICD-10-CM

## 2024-04-25 MED ORDER — FLECAINIDE ACETATE 150 MG PO TABS: 75.0000 mg | ORAL_TABLET | Freq: Two times a day (BID) | ORAL | 2 refills | Status: DC

## 2024-04-25 NOTE — Patient Instructions (Addendum)
 Medication Instructions:   INCREASE your Flecainide  at home to 50 mg THREE times a day until finished, THEN, take Flecainide  150 mg tablet 1/2 tablet ( 75 mg total ) twice a day.  Labwork: None today  Testing/Procedures: None today  Follow-Up:  3 weeks for Nurse visit for an EKG  6 months  Any Other Special Instructions Will Be Listed Below (If Applicable).  If you need a refill on your cardiac medications before your next appointment, please call your pharmacy.

## 2024-04-25 NOTE — Progress Notes (Signed)
 HPI Joy Hawkins returns for ongoing evaluation of atrial fib. She is a pleasant 78 yo woman who has been on both beta blockers and verapamil . She was placed on Xarelto  to prevent strokes. She notes palpitations and sob when she is in atrial fib. She wore a cardiac monitor which demonstrated 31% atrial fib burden. No prolonged pauses. Her ave HR was about 60/min. No syncope. When I saw her last she was started on low dose flecainide . In the interim she did well until a week ago when she noted some palpitations and dizziness and sob.  Allergies  Allergen Reactions   Molds & Smuts Cough and Shortness Of Breath   Other Cough, Itching and Shortness Of Breath   Biaxin [Clarithromycin]    Ciprofloxacin    Cymbalta [Duloxetine Hcl]    Levaquin [Levofloxacin In D5w]     Throat swelling/rash 07/05/15   Naproxen    Penicillins    Prozac  [Fluoxetine  Hcl] Other (See Comments)    Weakness, irregular hearbeat   Tequin [Gatifloxacin]    Vicodin [Hydrocodone-Acetaminophen]      Current Outpatient Medications  Medication Sig Dispense Refill   albuterol (PROVENTIL HFA;VENTOLIN HFA) 108 (90 BASE) MCG/ACT inhaler Inhale 2 puffs into the lungs every 6 (six) hours as needed for wheezing.     apixaban  (ELIQUIS ) 5 MG TABS tablet Take 1 tablet (5 mg total) by mouth 2 (two) times daily. 180 tablet 1   aspirin-acetaminophen-caffeine (EXCEDRIN MIGRAINE) 250-250-65 MG tablet Take 1 tablet by mouth every 6 (six) hours as needed for headache.     b complex vitamins tablet Take 1 tablet by mouth daily.     Calcium Carb-Cholecalciferol (CALCIUM 1000 + D PO)      Chlorpheniramine Maleate (ALLERGY RELIEF PO) Take 1 tablet by mouth daily as needed.     Cholecalciferol 125 MCG (5000 UT) TABS Take 1 tablet by mouth daily.     citalopram (CELEXA) 40 MG tablet Take 40 mg by mouth daily.     FEROSUL 325 (65 Fe) MG tablet Take 325 mg by mouth daily with breakfast.     flecainide  (TAMBOCOR ) 150 MG tablet Take 0.5  tablets (75 mg total) by mouth 2 (two) times daily. 90 tablet 2   levothyroxine (SYNTHROID) 88 MCG tablet Take 88 mcg by mouth daily.     MELATONIN GUMMIES PO Take 1 tablet by mouth as needed.     montelukast (SINGULAIR) 10 MG tablet Take 10 mg by mouth daily.      Multiple Vitamin (MULTIVITAMIN) tablet Take 1 tablet by mouth daily.     Omeprazole Magnesium  (PRILOSEC OTC PO) Take 1 capsule by mouth daily.     propranolol  (INDERAL ) 20 MG tablet Take 1 tablet (20 mg total) by mouth 2 (two) times daily. 60 tablet 11   rizatriptan (MAXALT) 10 MG tablet Take 10 mg by mouth 3 (three) times daily as needed.     vitamin C (ASCORBIC ACID) 500 MG tablet Take 500 mg by mouth every other day.      No current facility-administered medications for this visit.     Past Medical History:  Diagnosis Date   Arrhythmia 2015   Arthritis    Asthma 1951   Atrial fibrillation (HCC)    Chronic kidney disease    Depression    Essential tremor    Fibromyalgia    Headache    Hypertension 1990   Osteopenia    Thyroid  disease 1988    ROS:  All systems reviewed and negative except as noted in the HPI.   Past Surgical History:  Procedure Laterality Date   CHOLECYSTECTOMY     GYNECOLOGIC CRYOSURGERY     kidney stone removed       Family History  Problem Relation Age of Onset   Hypertension Mother    Stroke Mother    Cancer - Other Mother        basal cell   Dementia Mother    Arrhythmia Mother        SSS & A-fib   Hypertension Father    Stroke Father    Hyperlipidemia Father    Depression Father    Dementia Father    Heart disease Father    Dementia Sister    Depression Brother    Heart disease Brother    Depression Brother    Heart attack Brother        Fatal at age 42   Heart disease Brother    Depression Brother    Depression Other    Osteoporosis Other        family history     Social History   Socioeconomic History   Marital status: Widowed    Spouse name: Not on file    Number of children: Not on file   Years of education: Not on file   Highest education level: Not on file  Occupational History   Not on file  Tobacco Use   Smoking status: Never   Smokeless tobacco: Never  Vaping Use   Vaping status: Never Used  Substance and Sexual Activity   Alcohol  use: Yes    Comment: Occasional   Drug use: No   Sexual activity: Never  Other Topics Concern   Not on file  Social History Narrative   Not on file   Social Drivers of Health   Financial Resource Strain: Not on file  Food Insecurity: No Food Insecurity (01/27/2024)   Received from [facility]   Hunger Vital Sign    Worried About Running Out of Food in the Last Year: Never true    Ran Out of Food in the Last Year: Never true  Transportation Needs: No Transportation Needs (01/27/2024)   Received from [facility] - Transportation    Lack of Transportation (Medical): No    Lack of Transportation (Non-Medical): No  Physical Activity: Unknown (01/27/2024)   Received from [facility]   Exercise Vital Sign    Days of Exercise per Week: Not on file    Minutes of Exercise per Session: Patient declined  Stress: Not on file  Social Connections: Not on file  Intimate Partner Violence: Not At Risk (03/11/2024)   Received from [facility]   Humiliation, Afraid, Rape, and Kick questionnaire    Fear of Current or Ex-Partner: No    Emotionally Abused: No    Physically Abused: No    Sexually Abused: No     BP (!) 108/58   Pulse (!) 114   Ht 5' 4.5" (1.638 m)   Wt 217 lb (98.4 kg)   SpO2 96%   BMI 36.67 kg/m   Physical Exam:  Well appearing NAD HEENT: Unremarkable Neck:  No JVD, no thyromegally Lymphatics:  No adenopathy Back:  No CVA tenderness Lungs:  Clear with no wheezes HEART:  Regular rate rhythm, no murmurs, no rubs, no clicks Abd:  soft, positive bowel sounds, no organomegally, no rebound, no guarding Ext:  2 plus  pulses, no edema, no cyanosis, no  clubbing Skin:  No rashes no nodules Neuro:  CN II through XII intact, motor grossly intact  EKG - atypical atrial flutter  Assess/Plan: Atypical flutter - she will have her flecainide  increased to 150 mg daily in divided doses and return in 3 weeks for a 12 lead ECG.  Afib - she will continue flecainide .  Coags - she has not had any bleeding on eliquis .   Pete Brand Joy Macnaughton,MD

## 2024-05-12 DIAGNOSIS — H43812 Vitreous degeneration, left eye: Secondary | ICD-10-CM | POA: Diagnosis not present

## 2024-05-12 DIAGNOSIS — H02831 Dermatochalasis of right upper eyelid: Secondary | ICD-10-CM | POA: Diagnosis not present

## 2024-05-12 DIAGNOSIS — H01004 Unspecified blepharitis left upper eyelid: Secondary | ICD-10-CM | POA: Diagnosis not present

## 2024-05-12 DIAGNOSIS — H02834 Dermatochalasis of left upper eyelid: Secondary | ICD-10-CM | POA: Diagnosis not present

## 2024-05-12 DIAGNOSIS — H25813 Combined forms of age-related cataract, bilateral: Secondary | ICD-10-CM | POA: Diagnosis not present

## 2024-05-12 DIAGNOSIS — H01001 Unspecified blepharitis right upper eyelid: Secondary | ICD-10-CM | POA: Diagnosis not present

## 2024-05-12 DIAGNOSIS — H01002 Unspecified blepharitis right lower eyelid: Secondary | ICD-10-CM | POA: Diagnosis not present

## 2024-05-12 DIAGNOSIS — H52223 Regular astigmatism, bilateral: Secondary | ICD-10-CM | POA: Diagnosis not present

## 2024-05-12 DIAGNOSIS — H18413 Arcus senilis, bilateral: Secondary | ICD-10-CM | POA: Diagnosis not present

## 2024-05-12 DIAGNOSIS — H01005 Unspecified blepharitis left lower eyelid: Secondary | ICD-10-CM | POA: Diagnosis not present

## 2024-05-17 ENCOUNTER — Ambulatory Visit

## 2024-05-19 ENCOUNTER — Ambulatory Visit: Attending: Internal Medicine | Admitting: *Deleted

## 2024-05-19 DIAGNOSIS — I4891 Unspecified atrial fibrillation: Secondary | ICD-10-CM | POA: Diagnosis not present

## 2024-05-19 NOTE — Progress Notes (Signed)
 Pt states that she feels fine with dose increase of flecainide .

## 2024-05-31 ENCOUNTER — Encounter: Payer: Self-pay | Admitting: Internal Medicine

## 2024-06-13 ENCOUNTER — Ambulatory Visit: Attending: Internal Medicine

## 2024-06-13 ENCOUNTER — Other Ambulatory Visit: Payer: Self-pay

## 2024-06-13 DIAGNOSIS — R002 Palpitations: Secondary | ICD-10-CM

## 2024-06-13 DIAGNOSIS — I480 Paroxysmal atrial fibrillation: Secondary | ICD-10-CM

## 2024-06-13 DIAGNOSIS — I4891 Unspecified atrial fibrillation: Secondary | ICD-10-CM

## 2024-06-13 NOTE — Progress Notes (Unsigned)
 Enrolled for [employer] to mail a ZIO XT long term holter monitor to the patients address on file.

## 2024-06-22 ENCOUNTER — Other Ambulatory Visit: Payer: Self-pay | Admitting: Student

## 2024-06-23 NOTE — Telephone Encounter (Signed)
 Prescription refill request for Eliquis  received. Indication: afib  Last office visit: [place] 04/25/2024 Scr: 1.10, 01/15/2024 Age: 78 yo  Weight:  98.4 kg   Refill sent.

## 2024-06-28 NOTE — H&P (Signed)
 Surgical History & Physical  Patient Name: Joy Hawkins  DOB: 06/03/1946  Surgery: Cataract extraction with intraocular lens implant phacoemulsification; Right Eye Surgeon: Lynwood Hermann MD Surgery Date: 07/08/2024 Pre-Op Date: 05/12/2024  HPI: A 73 Yr. old female patient 1. The patient is here for a cataract evaluation. (Self-referred) Pt. complains of difficulty when recognizing people. Both eyes are affected. The episode is constant. This has been present for several years. The right eye is worse. Symptoms occur when the patient is driving and reading. The complaint is associated with blurry vision. This is negatively affecting the patient's quality of life and the patient is unable to function adequately in life with the current level of vision. HPI Completed by Dr. Lynwood Hermann  Medical History: Cataracts Extreme Myopia  Allergies, Migraines, Fibra myalgia, Anemia, tremors Heart Problem High Blood Pressure Lung Problems Thyroid  Problems  Review of Systems Cardiovascular High Blood Pressure, A-fib Endocrine Hypothyroidism Hematologic/Lymphatic Anemia Respiratory Asthma  Social Never smoked   Medication Montelukast, Levothyroxine, Citalopram, Propranolol , Flecainide , Flecainide , Eliquis , Prednisolone-moxiflox-bromfen  Sx/Procedures Gallbladder Removal, Uterine biopsy  Drug Allergies  penicillin   History & Physical: Heent: cataracts NECK: supple without bruits LUNGS: lungs clear to auscultation CV: regular rate and rhythm Abdomen: soft and non-tender  Impression & Plan: Assessment: 1.  COMBINED FORMS AGE RELATED CATARACT; Both Eyes (H25.813) 2.  PVD- old (posterior vitreous detachment); Left Eye (H43.812) 3.  BLEPHARITIS; Right Upper Lid, Right Lower Lid, Left Upper Lid, Left Lower Lid (H01.001, H01.002,H01.004,H01.005) 4.  DERMATOCHALASIS, no surgery; Right Upper Lid, Left Upper Lid (H02.831, H02.834) 5.  ARCUS SENILIS; Both Eyes (H18.413) 6.   ASTIGMATISM, REGULAR; Both Eyes (H52.223)  Plan: 1.  Cataract accounts for the patient's decreased vision. This visual impairment is not correctable with a tolerable change in glasses or contact lenses. Cataract surgery with an implantation of a new lens should significantly improve the visual and functional status of the patient. Discussed all risks, benefits, alternatives, and potential complications. Discussed the procedures and recovery. Patient desires to have surgery. A-scan ordered and performed today for intra-ocular lens calculations. The surgery will be performed in order to improve vision for driving, reading, and for eye examinations. Recommend phacoemulsification with intra-ocular lens. Recommend Dextenza for post-operative pain and inflammation. Right Eye worse. OD first, OS after. Dilates well - shugarcaine by protocol. Recommend toric IOL OD only.  2.  Asymptomatic. Explained the presence of PVD. Natural aging process of the vitreous gel discussed Signs and symptoms of RD reviewed Patient advised to call ASAP if vision decreases or symptoms increase.  3.  Blepharitis is present - recommend regular lid cleaning.  4.  Asymptomatic, recommend observation for now. Findings, prognosis and treatment options reviewed.  5.  Monitor.  6.  Recommend Toric Lens for cataract sx OD.

## 2024-06-30 DIAGNOSIS — I480 Paroxysmal atrial fibrillation: Secondary | ICD-10-CM | POA: Diagnosis not present

## 2024-06-30 DIAGNOSIS — R002 Palpitations: Secondary | ICD-10-CM | POA: Diagnosis not present

## 2024-06-30 DIAGNOSIS — H25811 Combined forms of age-related cataract, right eye: Secondary | ICD-10-CM | POA: Diagnosis not present

## 2024-07-05 ENCOUNTER — Encounter (HOSPITAL_COMMUNITY)
Admission: RE | Admit: 2024-07-05 | Discharge: 2024-07-05 | Disposition: A | Discharge status: Home / Self Care | Source: Ambulatory Visit | Attending: Ophthalmology | Admitting: Ophthalmology

## 2024-07-05 ENCOUNTER — Other Ambulatory Visit: Payer: Self-pay

## 2024-07-05 ENCOUNTER — Encounter (HOSPITAL_COMMUNITY): Payer: Self-pay

## 2024-07-05 HISTORY — DX: Gastro-esophageal reflux disease without esophagitis: K21.9

## 2024-07-05 HISTORY — DX: Hypothyroidism, unspecified: E03.9

## 2024-07-07 NOTE — Anesthesia Preprocedure Evaluation (Signed)
 Anesthesia Evaluation  Patient identified by MRN, date of birth, ID band Patient awake    Reviewed: Allergy & Precautions, H&P , NPO status , Patient's Chart, lab work & pertinent test results, reviewed documented beta blocker date and time   Airway Mallampati: II  TM Distance: >3 FB Neck ROM: full    Dental no notable dental hx. (+) Dental Advisory Given, Teeth Intact   Pulmonary asthma    Pulmonary exam normal breath sounds clear to auscultation       Cardiovascular Exercise Tolerance: Good hypertension, Normal cardiovascular exam+ dysrhythmias Atrial Fibrillation  Rhythm:regular Rate:Normal     Neuro/Psych  Headaches PSYCHIATRIC DISORDERS  Depression     Neuromuscular disease    GI/Hepatic Neg liver ROS,GERD  ,,  Endo/Other  Hypothyroidism    Renal/GU CRFRenal diseaseStage 2 CKD  negative genitourinary   Musculoskeletal  (+) Arthritis ,  Fibromyalgia -  Abdominal   Peds  Hematology negative hematology ROS (+)   Anesthesia Other Findings   Reproductive/Obstetrics negative OB ROS                              Anesthesia Physical Anesthesia Plan  ASA: 3  Anesthesia Plan: MAC   Post-op Pain Management: Minimal or no pain anticipated   Induction:   PONV Risk Score and Plan:   Airway Management Planned: Nasal Cannula and Natural Airway  Additional Equipment: None  Intra-op Plan:   Post-operative Plan:   Informed Consent: I have reviewed the patients History and Physical, chart, labs and discussed the procedure including the risks, benefits and alternatives for the proposed anesthesia with the patient or authorized representative who has indicated his/her understanding and acceptance.     Dental Advisory Given  Plan Discussed with: CRNA  Anesthesia Plan Comments:          Anesthesia Quick Evaluation

## 2024-07-08 ENCOUNTER — Encounter (HOSPITAL_COMMUNITY): Payer: Self-pay | Admitting: Ophthalmology

## 2024-07-08 ENCOUNTER — Ambulatory Visit (HOSPITAL_BASED_OUTPATIENT_CLINIC_OR_DEPARTMENT_OTHER): Admitting: Anesthesiology

## 2024-07-08 ENCOUNTER — Ambulatory Visit (HOSPITAL_COMMUNITY)
Admission: RE | Admit: 2024-07-08 | Discharge: 2024-07-08 | Disposition: A | Discharge status: Home / Self Care | Attending: Ophthalmology | Admitting: Ophthalmology

## 2024-07-08 ENCOUNTER — Encounter (HOSPITAL_COMMUNITY)
Admission: RE | Disposition: A | Discharge status: Home / Self Care | Payer: Self-pay | Source: Home / Self Care | Attending: Ophthalmology

## 2024-07-08 ENCOUNTER — Encounter (HOSPITAL_COMMUNITY): Admitting: Anesthesiology

## 2024-07-08 DIAGNOSIS — I129 Hypertensive chronic kidney disease with stage 1 through stage 4 chronic kidney disease, or unspecified chronic kidney disease: Secondary | ICD-10-CM | POA: Diagnosis not present

## 2024-07-08 DIAGNOSIS — H02834 Dermatochalasis of left upper eyelid: Secondary | ICD-10-CM | POA: Insufficient documentation

## 2024-07-08 DIAGNOSIS — E039 Hypothyroidism, unspecified: Secondary | ICD-10-CM | POA: Insufficient documentation

## 2024-07-08 DIAGNOSIS — I4891 Unspecified atrial fibrillation: Secondary | ICD-10-CM | POA: Diagnosis not present

## 2024-07-08 DIAGNOSIS — H25811 Combined forms of age-related cataract, right eye: Secondary | ICD-10-CM | POA: Diagnosis not present

## 2024-07-08 DIAGNOSIS — H43812 Vitreous degeneration, left eye: Secondary | ICD-10-CM | POA: Insufficient documentation

## 2024-07-08 DIAGNOSIS — N182 Chronic kidney disease, stage 2 (mild): Secondary | ICD-10-CM | POA: Diagnosis not present

## 2024-07-08 DIAGNOSIS — H25813 Combined forms of age-related cataract, bilateral: Secondary | ICD-10-CM | POA: Diagnosis not present

## 2024-07-08 DIAGNOSIS — J45909 Unspecified asthma, uncomplicated: Secondary | ICD-10-CM | POA: Insufficient documentation

## 2024-07-08 DIAGNOSIS — H5711 Ocular pain, right eye: Secondary | ICD-10-CM | POA: Diagnosis not present

## 2024-07-08 DIAGNOSIS — H02831 Dermatochalasis of right upper eyelid: Secondary | ICD-10-CM | POA: Diagnosis not present

## 2024-07-08 DIAGNOSIS — H0100B Unspecified blepharitis left eye, upper and lower eyelids: Secondary | ICD-10-CM | POA: Diagnosis not present

## 2024-07-08 DIAGNOSIS — H18413 Arcus senilis, bilateral: Secondary | ICD-10-CM | POA: Insufficient documentation

## 2024-07-08 DIAGNOSIS — H0100A Unspecified blepharitis right eye, upper and lower eyelids: Secondary | ICD-10-CM | POA: Diagnosis not present

## 2024-07-08 DIAGNOSIS — H52223 Regular astigmatism, bilateral: Secondary | ICD-10-CM | POA: Insufficient documentation

## 2024-07-08 DIAGNOSIS — F32A Depression, unspecified: Secondary | ICD-10-CM | POA: Insufficient documentation

## 2024-07-08 HISTORY — PX: INSERTION, STENT, DRUG-ELUTING, LACRIMAL CANALICULUS: SHX7453

## 2024-07-08 HISTORY — PX: CATARACT EXTRACTION W/PHACO: SHX586

## 2024-07-08 SURGERY — PHACOEMULSIFICATION, CATARACT, WITH IOL INSERTION
Anesthesia: Monitor Anesthesia Care | Site: Eye | Laterality: Right

## 2024-07-08 MED ORDER — TETRACAINE HCL 0.5 % OP SOLN
1.0000 [drp] | OPHTHALMIC | Status: AC | PRN
  Administered 2024-07-08 (×3): 1 [drp] via OPHTHALMIC

## 2024-07-08 MED ORDER — DEXAMETHASONE 0.4 MG OP INST
VAGINAL_INSERT | OPHTHALMIC | Status: AC
  Filled 2024-07-08: qty 1

## 2024-07-08 MED ORDER — TROPICAMIDE 1 % OP SOLN
1.0000 [drp] | OPHTHALMIC | Status: AC | PRN
  Administered 2024-07-08 (×3): 1 [drp] via OPHTHALMIC

## 2024-07-08 MED ORDER — DEXAMETHASONE 0.4 MG OP INST
VAGINAL_INSERT | OPHTHALMIC | Status: DC | PRN
  Administered 2024-07-08: .4 mg via OPHTHALMIC

## 2024-07-08 MED ORDER — PHENYLEPHRINE HCL 2.5 % OP SOLN
1.0000 [drp] | OPHTHALMIC | Status: AC | PRN
  Administered 2024-07-08 (×3): 1 [drp] via OPHTHALMIC

## 2024-07-08 MED ORDER — LIDOCAINE HCL 3.5 % OP GEL
1.0000 | Freq: Once | OPHTHALMIC | Status: AC
  Administered 2024-07-08: 1 via OPHTHALMIC

## 2024-07-08 MED ORDER — SODIUM HYALURONATE 23MG/ML IO SOSY
PREFILLED_SYRINGE | INTRAOCULAR | Status: DC | PRN
  Administered 2024-07-08: .6 mL via INTRAOCULAR

## 2024-07-08 MED ORDER — POVIDONE-IODINE 5 % OP SOLN
OPHTHALMIC | Status: DC | PRN
  Administered 2024-07-08: 1 via OPHTHALMIC

## 2024-07-08 MED ORDER — SODIUM CHLORIDE 0.9% FLUSH
INTRAVENOUS | Status: DC | PRN
Start: 2024-07-08 — End: 2024-07-08
  Administered 2024-07-08: 10 mL via INTRAVENOUS

## 2024-07-08 MED ORDER — MIDAZOLAM HCL 2 MG/2ML IJ SOLN
INTRAMUSCULAR | Status: AC
  Filled 2024-07-08: qty 2

## 2024-07-08 MED ORDER — STERILE WATER FOR IRRIGATION IR SOLN
Status: DC | PRN
  Administered 2024-07-08: 1000 mL

## 2024-07-08 MED ORDER — PHENYLEPHRINE-KETOROLAC 1-0.3 % IO SOLN
INTRAOCULAR | Status: DC | PRN
  Administered 2024-07-08: 500 mL via OPHTHALMIC

## 2024-07-08 MED ORDER — LACTATED RINGERS IV SOLN: INTRAVENOUS | Status: DC

## 2024-07-08 MED ORDER — BSS IO SOLN
INTRAOCULAR | Status: DC | PRN
  Administered 2024-07-08: 15 mL via INTRAOCULAR

## 2024-07-08 MED ORDER — MIDAZOLAM HCL 2 MG/2ML IJ SOLN
INTRAMUSCULAR | Status: DC | PRN
  Administered 2024-07-08: 2 mg via INTRAVENOUS

## 2024-07-08 MED ORDER — LIDOCAINE HCL (PF) 1 % IJ SOLN
INTRAMUSCULAR | Status: DC | PRN
  Administered 2024-07-08: 1 mL

## 2024-07-08 MED ORDER — SODIUM HYALURONATE 10 MG/ML IO SOLUTION
PREFILLED_SYRINGE | INTRAOCULAR | Status: DC | PRN
  Administered 2024-07-08: .85 mL via INTRAOCULAR

## 2024-07-08 SURGICAL SUPPLY — 12 items
CATARACT SUITE SIGHTPATH (MISCELLANEOUS) ×1 IMPLANT
CLOTH BEACON ORANGE TIMEOUT ST (SAFETY) ×1 IMPLANT
EYE SHIELD UNIVERSAL CLEAR (GAUZE/BANDAGES/DRESSINGS) IMPLANT
FEE CATARACT SUITE SIGHTPATH (MISCELLANEOUS) ×1 IMPLANT
GLOVE BIOGEL PI IND STRL 7.0 (GLOVE) ×2 IMPLANT
LENS IOL TECNIS EYHANCE 10.5 (Intraocular Lens) IMPLANT
NDL HYPO 18GX1.5 BLUNT FILL (NEEDLE) ×1 IMPLANT
NEEDLE HYPO 18GX1.5 BLUNT FILL (NEEDLE) ×1 IMPLANT
PAD ARMBOARD POSITIONER FOAM (MISCELLANEOUS) ×1 IMPLANT
SYR TB 1ML LL NO SAFETY (SYRINGE) ×1 IMPLANT
TAPE SURG TRANSPORE 1 IN (GAUZE/BANDAGES/DRESSINGS) IMPLANT
WATER STERILE IRR 250ML POUR (IV SOLUTION) ×1 IMPLANT

## 2024-07-08 NOTE — Op Note (Addendum)
 Date of procedure: 07/08/24  Pre-operative diagnosis:  Visually significant combined form age-related cataract, Right Eye (H25.811)  Post-operative diagnosis:   1. Visually significant combined form age-related cataract, Right Eye (H25.811) 2. Pain and inflammation following cataract surgery Right Eye (H57.11)  Procedure:  Removal of cataract via phacoemulsification and insertion of intra-ocular lens Johnson and Johnson DIB00 +10.5D into the capsular bag of the Right Eye 2. Placement of Dextenza  insert, Right Eye  Attending surgeon: Lynwood LABOR. Watt Geiler, MD, MA  Anesthesia: MAC, Topical Akten  Complications: None  Estimated Blood Loss: <81mL (minimal)  Specimens: None  Implants: As above  Indications:  Visually significant age-related cataract, Right Eye  Procedure:  The patient was seen and identified in the pre-operative area. The operative eye was identified and dilated.  The operative eye was marked.  Topical anesthesia was administered to the operative eye.     The patient was then to the operative suite and placed in the supine position.  A timeout was performed confirming the patient, procedure to be performed, and all other relevant information.   The patient's face was prepped and draped in the usual fashion for intra-ocular surgery.  A lid speculum was placed into the operative eye and the surgical microscope moved into place and focused.  A superotemporal paracentesis was created using a 20 gauge paracentesis blade. Omidria was injected into the anterior chamber. Shugarcaine was injected into the anterior chamber.  Viscoelastic was injected into the anterior chamber.  A temporal clear-corneal main wound incision was created using a 2.68mm microkeratome.  A continuous curvilinear capsulorrhexis was initiated using an irrigating cystitome and completed using capsulorrhexis forceps.  Hydrodissection and hydrodeliniation were performed.  Viscoelastic was injected into the anterior  chamber.  A phacoemulsification handpiece and a chopper as a second instrument were used to remove the nucleus and epinucleus. The irrigation/aspiration handpiece was used to remove any remaining cortical material.   The capsular bag was reinflated with viscoelastic, checked, and found to be intact.  The intraocular lens was inserted into the capsular bag.  The irrigation/aspiration handpiece was used to remove any remaining viscoelastic.  The clear corneal wound and paracentesis wounds were then hydrated and checked with Weck-Cels to be watertight.   The lid-speculum was removed. The lower punctum was dilated. A Dextenza  implant was placed in the lower canaliculus without complication.  The drape was removed.  The patient's face was cleaned with a wet and dry 4x4. A clear shield was taped over the eye. The patient was taken to the post-operative care unit in good condition, having tolerated the procedure well.  Post-Op Instructions: The patient will follow up at [facility] for a same day post-operative evaluation and will receive all other orders and instructions.

## 2024-07-08 NOTE — Interval H&P Note (Signed)
 History and Physical Interval Note:  07/08/2024 8:20 AM  Joy Hawkins  has presented today for surgery, with the diagnosis of combined forms age related cataract, right eye.  The various methods of treatment have been discussed with the patient and family. After consideration of risks, benefits and other options for treatment, the patient has consented to  Procedure(s): PHACOEMULSIFICATION, CATARACT, WITH IOL INSERTION (Right) INSERTION, STENT, DRUG-ELUTING, LACRIMAL CANALICULUS (Right) as a surgical intervention.  The patient's history has been reviewed, patient examined, no change in status, stable for surgery.  I have reviewed the patient's chart and labs.  Questions were answered to the patient's satisfaction.     HARRIE AGENT

## 2024-07-08 NOTE — Discharge Instructions (Addendum)
 Please discharge patient when stable, will follow up today with Dr. June Leap at the [facility] office immediately following discharge.  Leave shield in place until visit.  All paperwork with discharge instructions will be given at the office.  [facility] Address:  [address]  [place], [place] [address]

## 2024-07-08 NOTE — Anesthesia Postprocedure Evaluation (Signed)
 Anesthesia Post Note  Patient: Joy Hawkins  Procedure(s) Performed: PHACOEMULSIFICATION, CATARACT, WITH IOL INSERTION (Right: Eye) INSERTION, STENT, DRUG-ELUTING, LACRIMAL CANALICULUS (Right: Eye)  Patient location during evaluation: Phase II Anesthesia Type: MAC Level of consciousness: awake and alert Pain management: pain level controlled Vital Signs Assessment: post-procedure vital signs reviewed and stable Respiratory status: spontaneous breathing, nonlabored ventilation and respiratory function stable Cardiovascular status: stable and blood pressure returned to baseline Postop Assessment: no apparent nausea or vomiting Anesthetic complications: no   There were no known notable events for this encounter.   Last Vitals:  Vitals:   07/08/24 0715 07/08/24 0851  BP: 119/75 (!) 129/59  Pulse:  (!) 58  Resp: 16 12  Temp: 36.8 C 36.7 C  SpO2: 100% 99%    Last Pain:  Vitals:   07/08/24 0851  TempSrc: Oral  PainSc: 0-No pain                 [name]

## 2024-07-08 NOTE — Transfer of Care (Signed)
 Immediate Anesthesia Transfer of Care Note  Patient: Joy Hawkins  Procedure(s) Performed: PHACOEMULSIFICATION, CATARACT, WITH IOL INSERTION (Right: Eye) INSERTION, STENT, DRUG-ELUTING, LACRIMAL CANALICULUS (Right: Eye)  Patient Location: Short Stay  Anesthesia Type:MAC  Level of Consciousness: awake, alert , oriented, and patient cooperative  Airway & Oxygen Therapy: Patient Spontanous Breathing  Post-op Assessment: Report given to RN, Post -op Vital signs reviewed and stable, and Patient moving all extremities X 4  Post vital signs: Reviewed and stable  Last Vitals:  Vitals Value Taken Time  BP 129/59 07/08/24 08:51  Temp 36.7 C 07/08/24 08:51  Pulse 58 07/08/24 08:51  Resp 12 07/08/24 08:51  SpO2 99 % 07/08/24 08:51    Last Pain:  Vitals:   07/08/24 0851  TempSrc: Oral  PainSc: 0-No pain      Patients Stated Pain Goal: 3 (07/08/24 0713)  Complications: No notable events documented.

## 2024-07-11 ENCOUNTER — Encounter (HOSPITAL_COMMUNITY): Payer: Self-pay | Admitting: Ophthalmology

## 2024-07-19 DIAGNOSIS — H25812 Combined forms of age-related cataract, left eye: Secondary | ICD-10-CM | POA: Diagnosis not present

## 2024-07-20 NOTE — H&P (Signed)
 Surgical History & Physical  Patient Name: Joy Hawkins  DOB: 1946-06-20  Surgery: Cataract extraction with intraocular lens implant phacoemulsification; Left Eye Surgeon: Lynwood Hermann MD Surgery Date: 07/22/2024 Pre-Op Date: 07/19/2024  HPI: A 67 Yr. old female patient presents for a 11 day CEIOL PO OD and preop OS. Patient states that her OD is doing fantastic. Patient denies any ocular pain. Patient states that she has been compliant with use of her sx gtts as directed. Patient states that she has occasional burning when instilling sx gtts that only lasts a few moments. Patient states that she would like to proceed with sx with OS. Patient states that very very blurry. Patient states that she has difficulty with reading small print and seeing things from a distance OS. Patient states that she is bothered by glare both day and night OS. Patient states that she struggles with seeing street and traffic signs OS. This is negatively affecting the patient's quality of life and the patient is unable to function adequately in life with the current level of vision. HPI Completed by Dr. Lynwood Hermann  Medical History: Cataracts Extreme Myopia  Allergies, Migraines, Fibra myalgia, Anemia, tremors, Heart Problem High Blood Pressure   Lung Problems Thyroid  Problems  Review of Systems Cardiovascular High Blood Pressure, A-fib Endocrine Hypothyroidism Hemotologic/Lymphatic Anemia Respiratory Asthma  Social Never smoked   Medication Prednisolone-moxiflox-bromfen,  Montelukast, Levothyroxine, Citalopram, Propranolol , Flecainide , Flecainide , Eliquis , Prednisolone-moxiflox-bromfen  Sx/Procedures Phaco c IOL OD-dextenza , Gallbladder Removal, Uterine biopsy  Drug Allergies  penicillin   History & Physical: Heent: cataract NECK: supple without bruits LUNGS: lungs clear to auscultation CV: regular rate and rhythm Abdomen: soft and non-tender  Impression & Plan: Assessment: 1.   CATARACT EXTRACTION STATUS; Right Eye (Z98.41) 2.  COMBINED FORMS AGE RELATED CATARACT; Left Eye (H25.812)  Plan: 1.  2 weeks after cataract surgery. Doing well with improved vision and normal eye pressure. Call with any problems or concerns. Continue Pred-Mox-Brom Combo drop 2x/day for 4 days.  2.  Cataract accounts for the patient's decreased vision. This visual impairment is not correctable with a tolerable change in glasses or contact lenses. Cataract surgery with an implantation of a new lens should significantly improve the visual and functional status of the patient.Discussed all risks, benefits, alternatives, and potential complications. Discussed the procedures and recovery. Patient desires to have surgery. A-scan ordered and performed today for intra-ocular lens calculations. The surgery will be performed in order to improve vision for driving, reading, and for eye examinations. Recommend phacoemulsification with intra-ocular lens. Recommend Dextenza  for post-operative pain and inflammation. History of refractive Surgery: None Use of Eye Pressure Lowering Drops: None Left Eye. Surgery required to correct imbalance of vision. Dilates well - shugarcaine or Lidocaine +Omidira by protocol

## 2024-07-21 ENCOUNTER — Encounter (HOSPITAL_COMMUNITY)
Admission: RE | Admit: 2024-07-21 | Discharge: 2024-07-21 | Disposition: A | Discharge status: Home / Self Care | Source: Ambulatory Visit | Attending: Ophthalmology | Admitting: Ophthalmology

## 2024-07-21 ENCOUNTER — Encounter (HOSPITAL_COMMUNITY): Payer: Self-pay

## 2024-07-21 NOTE — Pre-Procedure Instructions (Signed)
 Attempted pre-op phonecall. Left VM for her to call us back.

## 2024-07-22 ENCOUNTER — Ambulatory Visit (HOSPITAL_COMMUNITY): Admitting: Anesthesiology

## 2024-07-22 ENCOUNTER — Encounter (HOSPITAL_COMMUNITY): Payer: Self-pay | Admitting: Ophthalmology

## 2024-07-22 ENCOUNTER — Encounter (HOSPITAL_COMMUNITY)
Admission: RE | Disposition: A | Discharge status: Home / Self Care | Payer: Self-pay | Source: Home / Self Care | Attending: Ophthalmology

## 2024-07-22 ENCOUNTER — Ambulatory Visit (HOSPITAL_COMMUNITY)
Admission: RE | Admit: 2024-07-22 | Discharge: 2024-07-22 | Disposition: A | Discharge status: Home / Self Care | Attending: Ophthalmology | Admitting: Ophthalmology

## 2024-07-22 DIAGNOSIS — I10 Essential (primary) hypertension: Secondary | ICD-10-CM

## 2024-07-22 DIAGNOSIS — E039 Hypothyroidism, unspecified: Secondary | ICD-10-CM | POA: Insufficient documentation

## 2024-07-22 DIAGNOSIS — K219 Gastro-esophageal reflux disease without esophagitis: Secondary | ICD-10-CM | POA: Insufficient documentation

## 2024-07-22 DIAGNOSIS — I4891 Unspecified atrial fibrillation: Secondary | ICD-10-CM | POA: Diagnosis not present

## 2024-07-22 DIAGNOSIS — J45909 Unspecified asthma, uncomplicated: Secondary | ICD-10-CM | POA: Diagnosis not present

## 2024-07-22 DIAGNOSIS — F32A Depression, unspecified: Secondary | ICD-10-CM

## 2024-07-22 DIAGNOSIS — H25812 Combined forms of age-related cataract, left eye: Secondary | ICD-10-CM | POA: Diagnosis not present

## 2024-07-22 DIAGNOSIS — M797 Fibromyalgia: Secondary | ICD-10-CM | POA: Insufficient documentation

## 2024-07-22 DIAGNOSIS — Z9841 Cataract extraction status, right eye: Secondary | ICD-10-CM | POA: Diagnosis not present

## 2024-07-22 DIAGNOSIS — M1990 Unspecified osteoarthritis, unspecified site: Secondary | ICD-10-CM | POA: Insufficient documentation

## 2024-07-22 DIAGNOSIS — I129 Hypertensive chronic kidney disease with stage 1 through stage 4 chronic kidney disease, or unspecified chronic kidney disease: Secondary | ICD-10-CM | POA: Diagnosis not present

## 2024-07-22 DIAGNOSIS — H5712 Ocular pain, left eye: Secondary | ICD-10-CM | POA: Insufficient documentation

## 2024-07-22 DIAGNOSIS — R519 Headache, unspecified: Secondary | ICD-10-CM | POA: Diagnosis not present

## 2024-07-22 DIAGNOSIS — N182 Chronic kidney disease, stage 2 (mild): Secondary | ICD-10-CM | POA: Diagnosis not present

## 2024-07-22 HISTORY — PX: INSERTION, STENT, DRUG-ELUTING, LACRIMAL CANALICULUS: SHX7453

## 2024-07-22 HISTORY — PX: CATARACT EXTRACTION W/PHACO: SHX586

## 2024-07-22 SURGERY — PHACOEMULSIFICATION, CATARACT, WITH IOL INSERTION
Anesthesia: Monitor Anesthesia Care | Site: Eye | Laterality: Left

## 2024-07-22 MED ORDER — BSS IO SOLN
INTRAOCULAR | Status: DC | PRN
  Administered 2024-07-22: 15 mL via INTRAOCULAR

## 2024-07-22 MED ORDER — POVIDONE-IODINE 5 % OP SOLN
OPHTHALMIC | Status: DC | PRN
  Administered 2024-07-22: 1 via OPHTHALMIC

## 2024-07-22 MED ORDER — MIDAZOLAM HCL 2 MG/2ML IJ SOLN
INTRAMUSCULAR | Status: DC | PRN
  Administered 2024-07-22: 2 mg via INTRAVENOUS

## 2024-07-22 MED ORDER — SODIUM CHLORIDE 0.9% FLUSH
INTRAVENOUS | Status: DC | PRN
Start: 2024-07-22 — End: 2024-07-22
  Administered 2024-07-22: 10 mL via INTRAVENOUS

## 2024-07-22 MED ORDER — NEOMYCIN-POLYMYXIN-DEXAMETH 3.5-10000-0.1 OP SUSP
OPHTHALMIC | Status: AC
  Filled 2024-07-22: qty 5

## 2024-07-22 MED ORDER — SODIUM HYALURONATE 10 MG/ML IO SOLUTION
PREFILLED_SYRINGE | INTRAOCULAR | Status: DC | PRN
Start: 2024-07-22 — End: 2024-07-22
  Administered 2024-07-22: .85 mL via INTRAOCULAR

## 2024-07-22 MED ORDER — LACTATED RINGERS IV SOLN: INTRAVENOUS | Status: DC

## 2024-07-22 MED ORDER — DEXAMETHASONE 0.4 MG OP INST
VAGINAL_INSERT | OPHTHALMIC | Status: DC | PRN
  Administered 2024-07-22: .4 mg via OPHTHALMIC

## 2024-07-22 MED ORDER — TETRACAINE HCL 0.5 % OP SOLN
1.0000 [drp] | OPHTHALMIC | Status: AC | PRN
  Administered 2024-07-22 (×3): 1 [drp] via OPHTHALMIC

## 2024-07-22 MED ORDER — LIDOCAINE HCL 3.5 % OP GEL
1.0000 | Freq: Once | OPHTHALMIC | Status: AC
  Administered 2024-07-22: 1 via OPHTHALMIC

## 2024-07-22 MED ORDER — MIDAZOLAM HCL 2 MG/2ML IJ SOLN
INTRAMUSCULAR | Status: AC
Start: 2024-07-22 — End: 2024-07-22
  Filled 2024-07-22: qty 2

## 2024-07-22 MED ORDER — LIDOCAINE HCL (PF) 1 % IJ SOLN
INTRAMUSCULAR | Status: DC | PRN
  Administered 2024-07-22: 1 mL

## 2024-07-22 MED ORDER — TROPICAMIDE 1 % OP SOLN
1.0000 [drp] | OPHTHALMIC | Status: AC | PRN
  Administered 2024-07-22 (×3): 1 [drp] via OPHTHALMIC

## 2024-07-22 MED ORDER — PHENYLEPHRINE HCL 2.5 % OP SOLN
1.0000 [drp] | OPHTHALMIC | Status: AC | PRN
  Administered 2024-07-22 (×3): 1 [drp] via OPHTHALMIC

## 2024-07-22 MED ORDER — STERILE WATER FOR IRRIGATION IR SOLN
Status: DC | PRN
  Administered 2024-07-22: 1

## 2024-07-22 MED ORDER — SODIUM HYALURONATE 23MG/ML IO SOSY
PREFILLED_SYRINGE | INTRAOCULAR | Status: DC | PRN
  Administered 2024-07-22: .6 mL via INTRAOCULAR

## 2024-07-22 MED ORDER — PHENYLEPHRINE-KETOROLAC 1-0.3 % IO SOLN
INTRAOCULAR | Status: DC | PRN
  Administered 2024-07-22: 500 mL via OPHTHALMIC

## 2024-07-22 MED ORDER — DEXAMETHASONE 0.4 MG OP INST
VAGINAL_INSERT | OPHTHALMIC | Status: AC
  Filled 2024-07-22: qty 1

## 2024-07-22 SURGICAL SUPPLY — 12 items
CATARACT SUITE SIGHTPATH (MISCELLANEOUS) ×1 IMPLANT
CLOTH BEACON ORANGE TIMEOUT ST (SAFETY) ×1 IMPLANT
EYE SHIELD UNIVERSAL CLEAR (GAUZE/BANDAGES/DRESSINGS) IMPLANT
FEE CATARACT SUITE SIGHTPATH (MISCELLANEOUS) ×1 IMPLANT
GLOVE BIOGEL PI IND STRL 7.0 (GLOVE) ×2 IMPLANT
LENS IOL TECNIS EYHANCE 10.5 (Intraocular Lens) IMPLANT
NDL HYPO 18GX1.5 BLUNT FILL (NEEDLE) ×1 IMPLANT
NEEDLE HYPO 18GX1.5 BLUNT FILL (NEEDLE) ×1 IMPLANT
PAD ARMBOARD POSITIONER FOAM (MISCELLANEOUS) ×1 IMPLANT
SYR TB 1ML LL NO SAFETY (SYRINGE) ×1 IMPLANT
TAPE SURG TRANSPORE 1 IN (GAUZE/BANDAGES/DRESSINGS) IMPLANT
WATER STERILE IRR 250ML POUR (IV SOLUTION) ×1 IMPLANT

## 2024-07-22 NOTE — Transfer of Care (Signed)
 Immediate Anesthesia Transfer of Care Note  Patient: Almarie FORBES Moeller  Procedure(s) Performed: PHACOEMULSIFICATION, CATARACT, WITH IOL INSERTION (Left: Eye) INSERTION, STENT, DRUG-ELUTING, LACRIMAL CANALICULUS (Left: Eye)  Patient Location: Short Stay  Anesthesia Type:MAC  Level of Consciousness: awake, alert , and oriented  Airway & Oxygen Therapy: Patient Spontanous Breathing  Post-op Assessment: Report given to RN and Post -op Vital signs reviewed and stable  Post vital signs: Reviewed and stable  Last Vitals:  Vitals Value Taken Time  BP 136/61 07/22/24 11:37  Temp 36.7 C 07/22/24 11:37  Pulse 60 07/22/24 11:37  Resp 11 07/22/24 11:37  SpO2 98 % 07/22/24 11:37    Last Pain:  Vitals:   07/22/24 1137  TempSrc: Oral  PainSc: 0-No pain      Patients Stated Pain Goal: 3 (07/22/24 1106)  Complications: No notable events documented.

## 2024-07-22 NOTE — Anesthesia Postprocedure Evaluation (Signed)
 Anesthesia Post Note  Patient: Joy Hawkins  Procedure(s) Performed: PHACOEMULSIFICATION, CATARACT, WITH IOL INSERTION (Left: Eye) INSERTION, STENT, DRUG-ELUTING, LACRIMAL CANALICULUS (Left: Eye)  Patient location during evaluation: Phase II Anesthesia Type: MAC Level of consciousness: awake Pain management: pain level controlled Vital Signs Assessment: post-procedure vital signs reviewed and stable Respiratory status: spontaneous breathing and respiratory function stable Cardiovascular status: blood pressure returned to baseline and stable Postop Assessment: no headache and no apparent nausea or vomiting Anesthetic complications: no Comments: Late entry   No notable events documented.   Last Vitals:  Vitals:   07/22/24 1106 07/22/24 1137  BP: 132/70 136/61  Pulse: 61 60  Resp:  11  Temp: 36.7 C 36.7 C  SpO2: 98% 98%    Last Pain:  Vitals:   07/22/24 1137  TempSrc: Oral  PainSc: 0-No pain                 Joy Hawkins

## 2024-07-22 NOTE — Interval H&P Note (Signed)
 History and Physical Interval Note:  07/22/2024 11:09 AM  Joy Hawkins  has presented today for surgery, with the diagnosis of combined forms age related cataract, left eye.  The various methods of treatment have been discussed with the patient and family. After consideration of risks, benefits and other options for treatment, the patient has consented to  Procedure(s): PHACOEMULSIFICATION, CATARACT, WITH IOL INSERTION (Left) INSERTION, STENT, DRUG-ELUTING, LACRIMAL CANALICULUS (Left) as a surgical intervention.  The patient's history has been reviewed, patient examined, no change in status, stable for surgery.  I have reviewed the patient's chart and labs.  Questions were answered to the patient's satisfaction.     HARRIE AGENT

## 2024-07-22 NOTE — Op Note (Addendum)
 Date of procedure: 07/22/24  Pre-operative diagnosis: Visually significant age-related combined cataract, Left Eye (H25.812)  Post-operative diagnosis:  Visually significant age-related combined cataract, Left Eye (H25.812) 2.   Pain and inflammation following cataract surgery, Left Eye (H57.12)  Procedure:  Removal of cataract via phacoemulsification and insertion of intra-ocular lens Johnson and Johnson DIB00 +10.5D into the capsular bag of the Left Eye 2. Placement of Dextenza  Implant, Left Lower Lid  Attending surgeon: Lynwood LABOR. Sereena Marando, MD, MA  Anesthesia: MAC, Topical Akten   Complications: None  Estimated Blood Loss: <21mL (minimal)  Specimens: None  Implants: As above  Indications:  Visually significant age-related cataract, Left Eye  Procedure:  The patient was seen and identified in the pre-operative area. The operative eye was identified and dilated.  The operative eye was marked.  Topical anesthesia was administered to the operative eye.     The patient was then to the operative suite and placed in the supine position.  A timeout was performed confirming the patient, procedure to be performed, and all other relevant information.   The patient's face was prepped and draped in the usual fashion for intra-ocular surgery.  A lid speculum was placed into the operative eye and the surgical microscope moved into place and focused.  An inferotemporal paracentesis was created using a 20 gauge paracentesis blade. Omidria  was injected into the anterior chamber. Shugarcaine was injected into the anterior chamber.  Viscoelastic was injected into the anterior chamber.  A temporal clear-corneal main wound incision was created using a 2.86mm microkeratome.  A continuous curvilinear capsulorrhexis was initiated using an irrigating cystitome and completed using capsulorrhexis forceps.  Hydrodissection and hydrodeliniation were performed.  Viscoelastic was injected into the anterior chamber.  A  phacoemulsification handpiece and a chopper as a second instrument were used to remove the nucleus and epinucleus. The irrigation/aspiration handpiece was used to remove any remaining cortical material.   The capsular bag was reinflated with viscoelastic, checked, and found to be intact.  The intraocular lens was inserted into the capsular bag.  The irrigation/aspiration handpiece was used to remove any remaining viscoelastic.  The clear corneal wound and paracentesis wounds were then hydrated and checked with Weck-Cels to be watertight.  The lid-speculum was removed. The lower punctum was dilated. A Dextenza  implant was placed in the lower canaliculus without complication.   The drape was removed.  The patient's face was cleaned with a wet and dry 4x4.   A clear shield was taped over the eye. The patient was taken to the post-operative care unit in good condition, having tolerated the procedure well.  Post-Op Instructions: The patient will follow up at [facility] for a same day post-operative evaluation and will receive all other orders and instructions.

## 2024-07-22 NOTE — Discharge Instructions (Addendum)
 Please discharge patient when stable, will follow up today with Dr. June Leap at the [facility] office immediately following discharge.  Leave shield in place until visit.  All paperwork with discharge instructions will be given at the office.  [facility] Address:  [address]  [place], [place] [address]

## 2024-07-22 NOTE — Anesthesia Preprocedure Evaluation (Signed)
 Anesthesia Evaluation  Patient identified by MRN, date of birth, ID band Patient awake    Reviewed: Allergy & Precautions, H&P , NPO status , Patient's Chart, lab work & pertinent test results, reviewed documented beta blocker date and time   Airway Mallampati: II  TM Distance: >3 FB Neck ROM: full    Dental no notable dental hx. (+) Dental Advisory Given, Teeth Intact   Pulmonary asthma    Pulmonary exam normal breath sounds clear to auscultation       Cardiovascular Exercise Tolerance: Good hypertension, Normal cardiovascular exam+ dysrhythmias Atrial Fibrillation  Rhythm:regular Rate:Normal     Neuro/Psych  Headaches PSYCHIATRIC DISORDERS  Depression     Neuromuscular disease    GI/Hepatic Neg liver ROS,GERD  ,,  Endo/Other  Hypothyroidism    Renal/GU CRFRenal diseaseStage 2 CKD  negative genitourinary   Musculoskeletal  (+) Arthritis ,  Fibromyalgia -  Abdominal   Peds  Hematology negative hematology ROS (+)   Anesthesia Other Findings   Reproductive/Obstetrics negative OB ROS                              Anesthesia Physical Anesthesia Plan  ASA: 3  Anesthesia Plan: MAC   Post-op Pain Management: Minimal or no pain anticipated   Induction:   PONV Risk Score and Plan:   Airway Management Planned: Nasal Cannula and Natural Airway  Additional Equipment: None  Intra-op Plan:   Post-operative Plan:   Informed Consent: I have reviewed the patients History and Physical, chart, labs and discussed the procedure including the risks, benefits and alternatives for the proposed anesthesia with the patient or authorized representative who has indicated his/her understanding and acceptance.     Dental Advisory Given  Plan Discussed with: CRNA  Anesthesia Plan Comments:          Anesthesia Quick Evaluation

## 2024-07-23 DIAGNOSIS — R002 Palpitations: Secondary | ICD-10-CM

## 2024-07-23 DIAGNOSIS — I4891 Unspecified atrial fibrillation: Secondary | ICD-10-CM

## 2024-07-23 DIAGNOSIS — I480 Paroxysmal atrial fibrillation: Secondary | ICD-10-CM | POA: Diagnosis not present

## 2024-07-25 ENCOUNTER — Encounter (HOSPITAL_COMMUNITY): Payer: Self-pay | Admitting: Ophthalmology

## 2024-07-28 ENCOUNTER — Ambulatory Visit: Admitting: Student

## 2024-08-07 ENCOUNTER — Ambulatory Visit: Payer: Self-pay | Admitting: Internal Medicine

## 2024-09-27 NOTE — Progress Notes (Unsigned)
 Cardiology Office Note    Date:  09/29/2024  ID:  EMMAJEAN RATLEDGE, DOB [birth year], MRN [id number] Cardiologist: Alvan Carrier, MD Electrophysiologist:  Danelle Birmingham, MD { :  History of Present Illness:    Joy Hawkins is a 78 y.o. female with past medical history of paroxysmal atrial fibrillation, HTN, hypothyroidism, fibromyalgia and anemia who presents to the office today for 76-month follow-up.   She was last examined by Dr. Birmingham in 04/2024 and had recently been experiencing more frequent palpitations and dizziness. Was recommended to increase Flecainide  to 75mg  BID and have a nurse visit for a repeat EKG in 3 weeks. A follow-up monitor was recommended to reassess her rhythm and this showed NSR with average HR of 61 bpm and rare PAC's and PVC's with < 1% burden.   In talking with the patient today, she reports overall feeling well 95% of the time. Has occasional episodes of weakness but she has checked her Kardia monitor when this occurs and is in normal sinus rhythm. Denies any persistent palpitations. No recent exertional chest pain or progressive dyspnea on exertion. Does have occasional shortness of breath but this improves with the use of Albuterol. No specific orthopnea, PND or pitting edema. No longer on diuretic therapy. Remains on Eliquis  for anticoagulation with no reports of melena, hematochezia or hematuria. Does experience easy bruising. She is due for follow-up labs with her PCP tomorrow and says a TSH is being rechecked at that time.  Studies Reviewed:   EKG: EKG is ordered today and demonstrates:   EKG Interpretation Date/Time:  Thursday September 29 2024 10:28:54 EDT Ventricular Rate:  64 PR Interval:    QRS Duration:  166 QT Interval:  448 QTC Calculation: 462 R Axis:   -78  Text Interpretation: NSR with 1st degree AV block. Left axis deviation Right bundle branch block Confirmed by Johnson Grate ([id number]) on 09/29/2024 10:43:03 AM        Echocardiogram: 08/2023 IMPRESSIONS     1. Left ventricular ejection fraction, by estimation, is 55 to 60%. The  left ventricle has normal function. The left ventricle has no regional  wall motion abnormalities. There is mild concentric left ventricular  hypertrophy. Left ventricular diastolic  parameters are indeterminate.   2. Right ventricular systolic function is normal. The right ventricular  size is normal. There is normal pulmonary artery systolic pressure. The  estimated right ventricular systolic pressure is 26.6 mmHg.   3. Left atrial size was mildly dilated.   4. The mitral valve is grossly normal. Trivial mitral valve  regurgitation.   5. The aortic valve is tricuspid. Aortic valve regurgitation is mild.  Aortic regurgitation PHT measures 663 msec.   6. The inferior vena cava is normal in size with greater than 50%  respiratory variability, suggesting right atrial pressure of 3 mmHg.   Comparison(s): Prior images unable to be directly viewed.     Cardiac Monitor: 07/2024 NSR with sinus brady (50/min) and NSR (78/min), ave 61/min. Rare PAC's and PVC's (<1%) No VT, SVT, atrial fib or pauses No symptoms Patch Wear Time:  2 days and 23 hours (2025-06-27T07:34:06-399 to 2025-06-30T06:53:40-0400)   Risk Assessment/Calculations:    CHA2DS2-VASc Score = 4   This indicates a 4.8% annual risk of stroke. The patient's score is based upon: CHF History: 0 HTN History: 1 Diabetes History: 0 Stroke History: 0 Vascular Disease History: 0 Age Score: 2 Gender Score: 1    Physical Exam:   VS:  BP 132/74  Pulse 64   Ht 5' 4.5 (1.638 m)   Wt 219 lb (99.3 kg)   SpO2 92%   BMI 37.01 kg/m    Wt Readings from Last 3 Encounters:  09/29/24 219 lb (99.3 kg)  07/21/24 216 lb 14.9 oz (98.4 kg)  07/08/24 216 lb 14.9 oz (98.4 kg)     GEN: Pleasant female appearing in no acute distress NECK: No JVD; No carotid bruits CARDIAC: RRR, no murmurs, rubs,  gallops RESPIRATORY:  Clear to auscultation without rales, wheezing or rhonchi  ABDOMEN: Appears non-distended. No obvious abdominal masses. EXTREMITIES: No clubbing or cyanosis. No pitting edema.  Distal pedal pulses are 2+ bilaterally.   Assessment and Plan:   1. Paroxysmal atrial fibrillation (HCC) - She denies any recent palpitations and has been in NSR when checked on her Kardia device. Corrected QTc on EKG today is ~ 413 ms given her underlying RBBB. Continue Flecainide  75mg  BID and Propranolol  20mg  BID. She does have follow-up with Dr. Waddell in 10/2024 and will keep that for now.   2. Current use of long term anticoagulation - Continue Eliquis  5mg  BID for anticoagulation which is the correct dose given her age (78 yo), weight (219 lbs) and renal function (creatinine at 0.92 when checked on 03/11/2024). No reports of active bleeding. She is due for follow-up labs tomorrow.   3. Essential hypertension - BP was initially recorded at 146/74, rechecked and improved to 132/74. Continue current medical therapy with Propranolol  20mg  BID.   4. Dyspnea on Exertion - She reports having intermittent dyspnea but no acute change in symptoms. Uses Albuterol with improvement. Recent echo was reassuring as outlined above. Will check a BNP with her follow-up labs tomorrow.   Signed, Laymon CHRISTELLA Qua, PA-C

## 2024-09-28 ENCOUNTER — Other Ambulatory Visit: Payer: Self-pay | Admitting: Internal Medicine

## 2024-09-29 ENCOUNTER — Ambulatory Visit: Attending: Student | Admitting: Student

## 2024-09-29 ENCOUNTER — Encounter: Payer: Self-pay | Admitting: Student

## 2024-09-29 VITALS — BP 132/74 | HR 64 | Ht 64.5 in | Wt 219.0 lb

## 2024-09-29 DIAGNOSIS — Z7901 Long term (current) use of anticoagulants: Secondary | ICD-10-CM

## 2024-09-29 DIAGNOSIS — I10 Essential (primary) hypertension: Secondary | ICD-10-CM

## 2024-09-29 DIAGNOSIS — R0609 Other forms of dyspnea: Secondary | ICD-10-CM | POA: Diagnosis not present

## 2024-09-29 DIAGNOSIS — I480 Paroxysmal atrial fibrillation: Secondary | ICD-10-CM

## 2024-09-29 NOTE — Patient Instructions (Signed)
 Medication Instructions:  Your physician recommends that you continue on your current medications as directed. Please refer to the Current Medication list given to you today.  *If you need a refill on your cardiac medications before your next appointment, please call your pharmacy*  Lab Work: Your physician recommends that you return for lab work in: BNP   If you have labs (blood work) drawn today and your tests are completely normal, you will receive your results only by: MyChart Message (if you have MyChart) OR A paper copy in the mail If you have any lab test that is abnormal or we need to change your treatment, we will call you to review the results.  Testing/Procedures: NONE   Follow-Up: At [facility], you and your health needs are our priority.  As part of our continuing mission to provide you with exceptional heart care, our providers are all part of one team.  This team includes your primary Cardiologist (physician) and Advanced Practice Providers or APPs (Physician Assistants and Nurse Practitioners) who all work together to provide you with the care you need, when you need it.  Your next appointment:    November   Provider:   Danelle Birmingham, MD    We recommend signing up for the patient portal called MyChart.  Sign up information is provided on this After Visit Summary.  MyChart is used to connect with patients for Virtual Visits (Telemedicine).  Patients are able to view lab/test results, encounter notes, upcoming appointments, etc.  Non-urgent messages can be sent to your provider as well.   To learn more about what you can do with MyChart, go to [email or web address].   Other Instructions Thank you for choosing [facility]!

## 2024-09-30 DIAGNOSIS — Z299 Encounter for prophylactic measures, unspecified: Secondary | ICD-10-CM | POA: Diagnosis not present

## 2024-09-30 DIAGNOSIS — Z23 Encounter for immunization: Secondary | ICD-10-CM | POA: Diagnosis not present

## 2024-09-30 DIAGNOSIS — I4891 Unspecified atrial fibrillation: Secondary | ICD-10-CM | POA: Diagnosis not present

## 2024-09-30 DIAGNOSIS — I10 Essential (primary) hypertension: Secondary | ICD-10-CM | POA: Diagnosis not present

## 2024-09-30 DIAGNOSIS — R52 Pain, unspecified: Secondary | ICD-10-CM | POA: Diagnosis not present

## 2024-09-30 DIAGNOSIS — Z0000 Encounter for general adult medical examination without abnormal findings: Secondary | ICD-10-CM | POA: Diagnosis not present

## 2024-09-30 DIAGNOSIS — J45909 Unspecified asthma, uncomplicated: Secondary | ICD-10-CM | POA: Diagnosis not present

## 2024-10-10 DIAGNOSIS — I480 Paroxysmal atrial fibrillation: Secondary | ICD-10-CM | POA: Diagnosis not present

## 2024-10-10 DIAGNOSIS — Z299 Encounter for prophylactic measures, unspecified: Secondary | ICD-10-CM | POA: Diagnosis not present

## 2024-10-10 DIAGNOSIS — R52 Pain, unspecified: Secondary | ICD-10-CM | POA: Diagnosis not present

## 2024-10-10 DIAGNOSIS — I10 Essential (primary) hypertension: Secondary | ICD-10-CM | POA: Diagnosis not present

## 2024-10-10 DIAGNOSIS — I4891 Unspecified atrial fibrillation: Secondary | ICD-10-CM | POA: Diagnosis not present

## 2024-10-10 DIAGNOSIS — G4730 Sleep apnea, unspecified: Secondary | ICD-10-CM | POA: Diagnosis not present

## 2024-10-11 ENCOUNTER — Ambulatory Visit: Payer: Self-pay | Admitting: Student

## 2024-10-11 LAB — BRAIN NATRIURETIC PEPTIDE: BNP: 80.3 pg/mL (ref 0.0–100.0)

## 2024-10-17 ENCOUNTER — Other Ambulatory Visit: Payer: Self-pay | Admitting: Medical Genetics

## 2024-10-17 DIAGNOSIS — Z006 Encounter for examination for normal comparison and control in clinical research program: Secondary | ICD-10-CM

## 2024-11-08 ENCOUNTER — Encounter: Payer: Self-pay | Admitting: Internal Medicine

## 2024-11-08 ENCOUNTER — Ambulatory Visit: Attending: Internal Medicine | Admitting: Internal Medicine

## 2024-11-08 VITALS — BP 122/74 | HR 69 | Ht 64.0 in | Wt 221.4 lb

## 2024-11-08 DIAGNOSIS — I480 Paroxysmal atrial fibrillation: Secondary | ICD-10-CM

## 2024-11-08 NOTE — Patient Instructions (Signed)

## 2024-11-08 NOTE — Progress Notes (Signed)
 HPI Joy Hawkins returns for ongoing evaluation of atrial fib. She is a pleasant 78 yo woman who has been on both beta blockers and verapamil . She was placed on Xarelto  to prevent strokes. She notes palpitations and sob when she is in atrial fib. She wore a cardiac monitor which demonstrated 31% atrial fib burden. No prolonged pauses. Her ave HR was about 60/min. No syncope. When I saw her previusly she was started on low dose flecainide . She has only rare palpitations. She admits to dietary indiscretion and continues to gain weight.  Allergies  Allergen Reactions   Molds & Smuts Cough and Shortness Of Breath   Other Cough, Itching and Shortness Of Breath   Biaxin [Clarithromycin]    Ciprofloxacin    Cymbalta [Duloxetine Hcl]    Levaquin [Levofloxacin In D5w]     Throat swelling/rash 07/05/15   Naproxen    Penicillins    Prozac  [Fluoxetine  Hcl] Other (See Comments)    Weakness, irregular hearbeat   Tequin [Gatifloxacin]    Vicodin [Hydrocodone-Acetaminophen ]      Current Outpatient Medications  Medication Sig Dispense Refill   AIRSUPRA 90-80 MCG/ACT AERO Take 2 puffs by mouth as needed.     albuterol (PROVENTIL HFA;VENTOLIN HFA) 108 (90 BASE) MCG/ACT inhaler Inhale 2 puffs into the lungs every 6 (six) hours as needed for wheezing.     amphetamine-dextroamphetamine (ADDERALL) 5 MG tablet Take 1 tablet by mouth daily as needed.     apixaban  (ELIQUIS ) 5 MG TABS tablet TAKE 1 TABLET BY MOUTH TWICE  DAILY 200 tablet 1   aspirin-acetaminophen -caffeine (EXCEDRIN MIGRAINE) 250-250-65 MG tablet Take 1 tablet by mouth every 6 (six) hours as needed for headache.     b complex vitamins tablet Take 1 tablet by mouth daily.     Calcium Carb-Cholecalciferol (CALCIUM 1000 + D PO)      Chlorpheniramine Maleate (ALLERGY RELIEF PO) Take 1 tablet by mouth daily as needed.     Cholecalciferol 125 MCG (5000 UT) TABS Take 1 tablet by mouth daily.     citalopram (CELEXA) 40 MG tablet Take 40 mg by  mouth daily.     FEROSUL 325 (65 Fe) MG tablet Take 325 mg by mouth daily with breakfast.     flecainide  (TAMBOCOR ) 150 MG tablet TAKE ONE-HALF TABLET BY MOUTH  TWICE DAILY 100 tablet 2   levothyroxine (SYNTHROID) 88 MCG tablet Take 88 mcg by mouth daily.     MELATONIN GUMMIES PO Take 1 tablet by mouth as needed.     montelukast (SINGULAIR) 10 MG tablet Take 10 mg by mouth daily.      Multiple Vitamin (MULTIVITAMIN) tablet Take 1 tablet by mouth daily.     Omeprazole Magnesium  (PRILOSEC OTC PO) Take 1 capsule by mouth daily.     propranolol  (INDERAL ) 20 MG tablet TAKE 1 TABLET BY MOUTH TWICE  DAILY 200 tablet 2   rizatriptan (MAXALT) 10 MG tablet Take 10 mg by mouth 3 (three) times daily as needed.     vitamin C (ASCORBIC ACID) 500 MG tablet Take 500 mg by mouth every other day.      No current facility-administered medications for this visit.     Past Medical History:  Diagnosis Date   Arrhythmia 2015   Arthritis    Asthma 1951   Atrial fibrillation (HCC)    Chronic kidney disease    Depression    Essential tremor    Fibromyalgia    GERD (gastroesophageal reflux disease)  Headache    Hypertension 1990   Hypothyroidism    Osteopenia    Thyroid  disease 1988    ROS:   All systems reviewed and negative except as noted in the HPI.   Past Surgical History:  Procedure Laterality Date   CATARACT EXTRACTION W/PHACO Right 07/08/2024   Procedure: PHACOEMULSIFICATION, CATARACT, WITH IOL INSERTION;  Surgeon: Harrie Agent, MD;  Location: [facility];  Service: Ophthalmology;  Laterality: Right;  CDE 19.56   CATARACT EXTRACTION W/PHACO Left 07/22/2024   Procedure: PHACOEMULSIFICATION, CATARACT, WITH IOL INSERTION;  Surgeon: Harrie Agent, MD;  Location: [facility];  Service: Ophthalmology;  Laterality: Left;  CDE: 8.94   CHOLECYSTECTOMY     GYNECOLOGIC CRYOSURGERY     INSERTION, STENT, DRUG-ELUTING, LACRIMAL CANALICULUS Right 07/08/2024   Procedure: INSERTION, STENT, DRUG-ELUTING, LACRIMAL  CANALICULUS;  Surgeon: Harrie Agent, MD;  Location: [facility];  Service: Ophthalmology;  Laterality: Right;   INSERTION, STENT, DRUG-ELUTING, LACRIMAL CANALICULUS Left 07/22/2024   Procedure: INSERTION, STENT, DRUG-ELUTING, LACRIMAL CANALICULUS;  Surgeon: Harrie Agent, MD;  Location: [facility];  Service: Ophthalmology;  Laterality: Left;   kidney stone removed       Family History  Problem Relation Age of Onset   Hypertension Mother    Stroke Mother    Cancer - Other Mother        basal cell   Dementia Mother    Arrhythmia Mother        SSS & A-fib   Hypertension Father    Stroke Father    Hyperlipidemia Father    Depression Father    Dementia Father    Heart disease Father    Dementia Sister    Depression Brother    Heart disease Brother    Depression Brother    Heart attack Brother        Fatal at age 61   Heart disease Brother    Depression Brother    Depression Other    Osteoporosis Other        family history     Social History   Socioeconomic History   Marital status: Widowed    Spouse name: Not on file   Number of children: Not on file   Years of education: Not on file   Highest education level: Not on file  Occupational History   Not on file  Tobacco Use   Smoking status: Never   Smokeless tobacco: Never  Vaping Use   Vaping status: Never Used  Substance and Sexual Activity   Alcohol  use: Yes    Comment: Occasional-one drink every few weeks   Drug use: No   Sexual activity: Never  Other Topics Concern   Not on file  Social History Narrative   Not on file   Social Drivers of Health   Financial Resource Strain: Not on file  Food Insecurity: No Food Insecurity (01/27/2024)   Received from [facility]   Hunger Vital Sign    Within the past 12 months, you worried that your food would run out before you got the money to buy more.: Never true    Within the past 12 months, the food you bought just didn't last and you didn't have money to get more.:  Never true  Transportation Needs: No Transportation Needs (01/27/2024)   Received from [facility] - Transportation    Lack of Transportation (Medical): No    Lack of Transportation (Non-Medical): No  Physical Activity: Unknown (01/27/2024)   Received from  [facility]   Exercise Vital Sign    Days of Exercise per Week: Not on file    On average, how many minutes do you engage in exercise at this level?: Patient declined  Stress: Not on file  Social Connections: Not on file  Intimate Partner Violence: Not At Risk (03/11/2024)   Received from [facility]   Humiliation, Afraid, Rape, and Kick questionnaire    Within the last year, have you been afraid of your partner or ex-partner?: No    Within the last year, have you been humiliated or emotionally abused in other ways by your partner or ex-partner?: No    Within the last year, have you been kicked, hit, slapped, or otherwise physically hurt by your partner or ex-partner?: No    Within the last year, have you been raped or forced to have any kind of sexual activity by your partner or ex-partner?: No     Ht 5' 4 (1.626 m)   Wt 221 lb 6.4 oz (100.4 kg)   BMI 38.00 kg/m   Physical Exam:  Well appearing NAD HEENT: Unremarkable Neck:  No JVD, no thyromegally Lymphatics:  No adenopathy Back:  No CVA tenderness Lungs:  Clear HEART:  Regular rate rhythm, no murmurs, no rubs, no clicks Abd:  soft, positive bowel sounds, no organomegally, no rebound, no guarding Ext:  2 plus pulses, no edema, no cyanosis, no clubbing Skin:  No rashes no nodules Neuro:  CN II through XII intact, motor grossly intact  Assess/Plan:  Atypical flutter - she will have her flecainide  increased to 150 mg daily in divided doses and her most recent ECG is acceptable. We will follow. Afib - she will continue flecainide .  Coags - she has not had any bleeding on eliquis .    Joy Rola Lennon,MD

## 2024-11-28 ENCOUNTER — Other Ambulatory Visit: Payer: Self-pay | Admitting: Student

## 2024-11-29 NOTE — Telephone Encounter (Signed)
 Prescription refill request for Eliquis  received. Indication:afib Last office visit:11/25 Scr: 0.92  3/25 Age:78 Weight:100.4  kg  Prescription refilled

## 2024-12-13 LAB — GENECONNECT MOLECULAR SCREEN: Genetic Analysis Overall Interpretation: NEGATIVE
